# Patient Record
Sex: Male | Born: 1965 | Race: White | Hispanic: No | Marital: Married | Smoking: Never smoker
Health system: Southern US, Community
[De-identification: ages and names within clinical notes are randomized; demographics above are authoritative.]

## PROBLEM LIST (undated history)

## (undated) DIAGNOSIS — R0600 Dyspnea, unspecified: Secondary | ICD-10-CM

## (undated) DIAGNOSIS — M1990 Unspecified osteoarthritis, unspecified site: Secondary | ICD-10-CM

## (undated) DIAGNOSIS — J45909 Unspecified asthma, uncomplicated: Secondary | ICD-10-CM

## (undated) DIAGNOSIS — Z87442 Personal history of urinary calculi: Secondary | ICD-10-CM

## (undated) DIAGNOSIS — K219 Gastro-esophageal reflux disease without esophagitis: Secondary | ICD-10-CM

## (undated) DIAGNOSIS — F419 Anxiety disorder, unspecified: Secondary | ICD-10-CM

## (undated) DIAGNOSIS — T8859XA Other complications of anesthesia, initial encounter: Secondary | ICD-10-CM

## (undated) DIAGNOSIS — J189 Pneumonia, unspecified organism: Secondary | ICD-10-CM

## (undated) DIAGNOSIS — I10 Essential (primary) hypertension: Secondary | ICD-10-CM

## (undated) DIAGNOSIS — G4730 Sleep apnea, unspecified: Secondary | ICD-10-CM

## (undated) HISTORY — PX: HERNIA REPAIR: SHX51

## (undated) HISTORY — PX: LIPOMA EXCISION: SHX5283

---
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
## 2015-09-09 ENCOUNTER — Encounter (HOSPITAL_COMMUNITY): Payer: Self-pay

## 2015-09-09 ENCOUNTER — Emergency Department (HOSPITAL_COMMUNITY): Payer: Medicaid Other

## 2015-09-09 ENCOUNTER — Emergency Department (HOSPITAL_COMMUNITY)
Admission: EM | Admit: 2015-09-09 | Discharge: 2015-09-09 | Disposition: A | Discharge status: Home / Self Care | Payer: Medicaid Other | Attending: Emergency Medicine | Admitting: Emergency Medicine

## 2015-09-09 DIAGNOSIS — R0602 Shortness of breath: Secondary | ICD-10-CM

## 2015-09-09 DIAGNOSIS — R072 Precordial pain: Secondary | ICD-10-CM | POA: Diagnosis present

## 2015-09-09 DIAGNOSIS — J45901 Unspecified asthma with (acute) exacerbation: Secondary | ICD-10-CM | POA: Insufficient documentation

## 2015-09-09 DIAGNOSIS — K219 Gastro-esophageal reflux disease without esophagitis: Secondary | ICD-10-CM | POA: Diagnosis not present

## 2015-09-09 HISTORY — DX: Pneumonia, unspecified organism: J18.9

## 2015-09-09 LAB — BASIC METABOLIC PANEL
Anion gap: 9 (ref 5–15)
BUN: 17 mg/dL (ref 6–20)
CHLORIDE: 108 mmol/L (ref 101–111)
CO2: 23 mmol/L (ref 22–32)
Calcium: 9.7 mg/dL (ref 8.9–10.3)
Creatinine, Ser: 1.2 mg/dL (ref 0.61–1.24)
GFR calc non Af Amer: >60 mL/min (ref >60)
Glucose, Bld: 76 mg/dL (ref 65–99)
POTASSIUM: 4 mmol/L (ref 3.5–5.1)
Sodium: 140 mmol/L (ref 135–145)

## 2015-09-09 LAB — I-STAT TROPONIN, ED
TROPONIN I, POC: 0 ng/mL (ref 0.00–0.08)
Troponin i, poc: 0 ng/mL (ref 0.00–0.08)

## 2015-09-09 LAB — CBC
HEMATOCRIT: 48.1 % (ref 39.0–52.0)
HEMOGLOBIN: 16.3 g/dL (ref 13.0–17.0)
MCH: 29.6 pg (ref 26.0–34.0)
MCHC: 33.9 g/dL (ref 30.0–36.0)
MCV: 87.5 fL (ref 78.0–100.0)
PLATELETS: 198 10*3/uL (ref 150–400)
RBC: 5.5 MIL/uL (ref 4.22–5.81)
RDW: 12.9 % (ref 11.5–15.5)
WBC: 10.3 10*3/uL (ref 4.0–10.5)

## 2015-09-09 LAB — I-STAT CG4 LACTIC ACID, ED: LACTIC ACID, VENOUS: 1.79 mmol/L (ref 0.5–1.9)

## 2015-09-09 MED ORDER — ASPIRIN 81 MG PO CHEW
324.0000 mg | CHEWABLE_TABLET | Freq: Once | ORAL | Status: AC
  Administered 2015-09-09: 324 mg via ORAL
  Filled 2015-09-09: qty 4

## 2015-09-09 MED ORDER — FLUTICASONE-SALMETEROL 100-50 MCG/DOSE IN AEPB: 1.0000 | INHALATION_SPRAY | Freq: Every day | RESPIRATORY_TRACT | Status: DC

## 2015-09-09 MED ORDER — SODIUM CHLORIDE 0.9 % IV BOLUS (SEPSIS)
1000.0000 mL | Freq: Once | INTRAVENOUS | Status: AC
  Administered 2015-09-09: 1000 mL via INTRAVENOUS

## 2015-09-09 MED ORDER — IPRATROPIUM-ALBUTEROL 0.5-2.5 (3) MG/3ML IN SOLN
3.0000 mL | Freq: Once | RESPIRATORY_TRACT | Status: AC
  Administered 2015-09-09: 3 mL via RESPIRATORY_TRACT
  Filled 2015-09-09: qty 3

## 2015-09-09 NOTE — ED Notes (Addendum)
Patient here with recurrent chest pain with cough and congestion for the past week. 3 weeks ago had similar symptoms and went to urgent care and diagnosed with pneumonia. Took medications as prescribed and now complains of feeling bad again. No distress on assessment. States having CP and increased shortness of breath that worsened today.

## 2015-09-09 NOTE — Discharge Instructions (Signed)

## 2015-09-09 NOTE — ED Provider Notes (Signed)
CSN: [id number]     Arrival date & time 09/09/15  1500 History   First MD Initiated Contact with Patient 09/09/15 1642     Chief Complaint  Patient presents with  . Chest Pain  . Cough     (Consider location/radiation/quality/duration/timing/severity/associated sxs/prior Treatment) Patient is a 50 y.o. male presenting with chest pain and cough.  Chest Pain Pain location:  Substernal area Pain quality: pressure and tightness   Pain radiates to:  Does not radiate Pain radiates to the back: no   Pain severity:  Mild Onset quality:  Gradual Duration:  3 days Timing:  Intermittent Progression:  Waxing and waning Context comment:  Pain gets worse working outside in the heat and dust Relieved by: Albuterol inhaler. Relief lasts approximately 15 minutes. Worsened by:  Coughing and deep breathing Associated symptoms: cough, heartburn (patient states he gets heartburn daily despite omeprazole for last 3-4 years), nausea and shortness of breath   Associated symptoms: no abdominal pain, no altered mental status, no anorexia, no back pain, no diaphoresis, no dizziness, no dysphagia, no fatigue, no fever, no headache, no near-syncope, no numbness, no orthopnea, no palpitations, not vomiting and no weakness   Cough Associated symptoms: chest pain and shortness of breath   Associated symptoms: no chills, no diaphoresis, no fever, no headaches and no rash     Past Medical History  Diagnosis Date  . Pneumonia    History reviewed. No pertinent past surgical history. No family history on file. Social History  Substance Use Topics  . Smoking status: Never Smoker   . Smokeless tobacco: None  . Alcohol Use: None    Review of Systems  Constitutional: Negative for fever, chills, diaphoresis and fatigue.  HENT: Negative for congestion and trouble swallowing.   Eyes: Negative for visual disturbance.  Respiratory: Positive for cough, chest tightness and shortness of breath.   Cardiovascular:  Positive for chest pain. Negative for palpitations, orthopnea and near-syncope.  Gastrointestinal: Positive for heartburn (patient states he gets heartburn daily despite omeprazole for last 3-4 years) and nausea. Negative for vomiting, abdominal pain and anorexia.  Genitourinary: Negative for dysuria and flank pain.  Musculoskeletal: Negative for back pain and neck pain.  Skin: Negative for rash.  Neurological: Negative for dizziness, weakness, numbness and headaches.  Psychiatric/Behavioral: Negative for confusion and agitation.      Allergies  Review of patient's allergies indicates no known allergies.  Home Medications   Prior to Admission medications   Medication Sig Start Date End Date Taking? Authorizing Provider  Fluticasone-Salmeterol (ADVAIR DISKUS) 100-50 MCG/DOSE AEPB Inhale 1 puff into the lungs daily. 09/09/15   Levora AngelEric Elly Haffey, MD  omeprazole (PRILOSEC) 20 MG capsule Take 20 mg by mouth daily.   Yes Historical Provider, MD   BP 146/97 mmHg  Pulse 86  Temp(Src) 98.2 F (36.8 C) (Oral)  Resp 21  Ht 5\' 7"  (1.702 m)  Wt 95.255 kg  BMI 32.88 kg/m2  SpO2 97% Physical Exam  Constitutional: He is oriented to person, place, and time. He appears well-developed and well-nourished. No distress.  HENT:  Head: Normocephalic and atraumatic.  Eyes: Conjunctivae are normal.  Cardiovascular: Normal rate and normal heart sounds.   No murmur heard. Pulmonary/Chest: Effort normal. No respiratory distress. He has wheezes. He has rales.  Abdominal: Soft. There is no tenderness. There is no rebound.  Musculoskeletal: He exhibits no edema.  Neurological: He is alert and oriented to person, place, and time.  Skin: Skin is warm. He is not diaphoretic.  Psychiatric: He has a normal mood and affect. His behavior is normal.  Nursing note and vitals reviewed.   ED Course  Procedures (including critical care time) Labs Review Labs Reviewed  BASIC METABOLIC PANEL  CBC  I-STAT TROPOININ,  ED  I-STAT CG4 LACTIC ACID, ED  I-STAT TROPOININ, ED  Rosezena Sensor, ED    Imaging Review Dg Chest 2 View  09/09/2015  CLINICAL DATA:  50 year old male with chest pain, cough, congestion and shortness of breath for 1 week. EXAM: CHEST  2 VIEW COMPARISON:  None. FINDINGS: Upper limits normal heart size noted. There is no evidence of focal airspace disease, pulmonary edema, suspicious pulmonary nodule/mass, pleural effusion, or pneumothorax. No acute bony abnormalities are identified. A remote left rib fractures present. IMPRESSION: Upper limits normal heart size without evidence of active cardiopulmonary disease. Electronically Signed   By: Harmon Pier M.D.   On: 09/09/2015 16:01   I have personally reviewed and evaluated these images and lab results as part of my medical decision-making.   EKG Interpretation   Date/Time:  Saturday September 09 2015 15:09:30 EDT Ventricular Rate:  111 PR Interval:  152 QRS Duration: 74 QT Interval:  314 QTC Calculation: 427 R Axis:   36 Text Interpretation:  Sinus tachycardia No previous tracing Confirmed by  Denton Lank  MD, Caryn Bee ([id number]) on 09/09/2015 4:43:24 PM      MDM   Final diagnoses:  Shortness of breath  Asthma, unspecified asthma severity, with acute exacerbation  Gastroesophageal reflux disease, esophagitis presence not specified   Patient presents with approximately 2 months of intermittent shortness of breath, chest pain exacerbated by cough and fatigue. He states symptoms are improved for approximately 15-20 minutes after albuterol inhaler. He's only started using the inhaler in the last 3-4 days. He was diagnosed with pneumonia and given a course of azithromycin, by mouth steroids, and the inhaler approximately one week ago.  Chest x-ray today shows no evidence of cardiac or pulmonary abnormality. EKG shows sinus tachycardia with ventricular rate of 111 bpm, no abnormal intervals, dysrhythmia, or signs of ischemia. Tachycardia likely due to  albuterol inhaler just prior to arrival.  Patient reports shortness of breath improved with DuoNeb treatment. Sensation of heaviness in his chest no longer present. Delta troponin was negative 2. Given patient has multiple exposures to dust and heat in his daily job I think pulmonary disease is the most likely etiology of the shortness of breath. Specifically I'm concerned for late onset asthma. Given the unusual presentation of his asthma and the multiple occupational exposures, he was given a referral to pulmonology for further workup and evaluation of his shortness of breath. I have low concern for cardiac etiology given symptom association with heat and occupational exposures and relief with albuterol.  Patient was discharged home in good condition with prescription for Advair discus inhaler and was encouraged to continue using his albuterol as directed.    Levora Angel, MD 09/10/15 6045  Levora Angel, MD 09/10/15 4098  Cathren Laine, MD 09/10/15 1535

## 2015-12-31 ENCOUNTER — Encounter (HOSPITAL_COMMUNITY): Payer: Self-pay | Admitting: Emergency Medicine

## 2015-12-31 ENCOUNTER — Emergency Department (HOSPITAL_COMMUNITY)
Admission: EM | Admit: 2015-12-31 | Discharge: 2015-12-31 | Disposition: A | Discharge status: Home / Self Care | Payer: Medicaid Other | Attending: Emergency Medicine | Admitting: Emergency Medicine

## 2015-12-31 ENCOUNTER — Emergency Department (HOSPITAL_COMMUNITY): Payer: Medicaid Other

## 2015-12-31 DIAGNOSIS — W268XXA Contact with other sharp object(s), not elsewhere classified, initial encounter: Secondary | ICD-10-CM | POA: Insufficient documentation

## 2015-12-31 DIAGNOSIS — I10 Essential (primary) hypertension: Secondary | ICD-10-CM | POA: Insufficient documentation

## 2015-12-31 DIAGNOSIS — S61219A Laceration without foreign body of unspecified finger without damage to nail, initial encounter: Secondary | ICD-10-CM

## 2015-12-31 DIAGNOSIS — Y999 Unspecified external cause status: Secondary | ICD-10-CM | POA: Insufficient documentation

## 2015-12-31 DIAGNOSIS — Y939 Activity, unspecified: Secondary | ICD-10-CM | POA: Diagnosis not present

## 2015-12-31 DIAGNOSIS — S61210A Laceration without foreign body of right index finger without damage to nail, initial encounter: Secondary | ICD-10-CM | POA: Insufficient documentation

## 2015-12-31 DIAGNOSIS — Y929 Unspecified place or not applicable: Secondary | ICD-10-CM | POA: Diagnosis not present

## 2015-12-31 DIAGNOSIS — S61212A Laceration without foreign body of right middle finger without damage to nail, initial encounter: Secondary | ICD-10-CM

## 2015-12-31 HISTORY — DX: Essential (primary) hypertension: I10

## 2015-12-31 NOTE — ED Notes (Signed)
Declined W/C at D/C and was escorted to lobby by RN. 

## 2015-12-31 NOTE — ED Triage Notes (Signed)
Pt. Stated, I was messing around with metal in the back building and cut my rt. 2 fingers.

## 2015-12-31 NOTE — ED Provider Notes (Signed)
[facility]-EMERGENCY DEPT Provider Note   CSN: [id number] Arrival date & time: 12/31/15  1254  By signing my name below, I, Clarisse GougeXavier Herndon, attest that this documentation has been prepared under the direction and in the presence of Cheri FowlerKayla Zubayr Bednarczyk, [place]. Electronically Signed: Clarisse GougeXavier Herndon, Scribe. 12/31/15. 2:25 PM.    History   Chief Complaint Chief Complaint  Patient presents with  . Finger Injury   The history is provided by the patient. No language interpreter was used.   HPI Comments: Shawn Owens is a 50 y.o. male who presents to the Emergency Department complaining of right index and middle digit laceration. He states that he was working on metal when the metal sheet slipped and cut his fingers. Pt reports associated throbbing pain. He denies numbness or weakness. Able to move all fingers without difficulty. No numbness or weakness.   Tetanus UTD.  Past Medical History:  Diagnosis Date  . Hypertension   . Pneumonia     There are no active problems to display for this patient.   History reviewed. No pertinent surgical history.     Home Medications    Prior to Admission medications   Medication Sig Start Date End Date Taking? Authorizing Provider  lisinopril (PRINIVIL,ZESTRIL) 20 MG tablet Take 20 mg by mouth daily.   Yes Historical Provider, MD  Fluticasone-Salmeterol (ADVAIR DISKUS) 100-50 MCG/DOSE AEPB Inhale 1 puff into the lungs daily. 09/09/15   Levora AngelEric Engstrom, MD  omeprazole (PRILOSEC) 20 MG capsule Take 20 mg by mouth daily.    Historical Provider, MD    Family History No family history on file.  Social History Social History  Substance Use Topics  . Smoking status: Never Smoker  . Smokeless tobacco: Never Used  . Alcohol use No     Allergies   Patient has no known allergies.   Review of Systems Review of Systems  Skin: Positive for wound.  Neurological: Negative for weakness and numbness.  All other systems reviewed and are negative.    Physical  Exam Updated Vital Signs BP 133/97 (BP Location: Left Arm)   Pulse 78   Temp 98.2 F (36.8 C) (Oral)   Resp 17   Ht 5\' 10"  (1.778 m)   Wt 99 kg   SpO2 97%   BMI 31.32 kg/m   Physical Exam  Constitutional: He is oriented to person, place, and time. He appears well-developed and well-nourished.  HENT:  Head: Normocephalic and atraumatic.  Eyes: Conjunctivae are normal.  Neck: Normal range of motion.  Cardiovascular:  Capillary refill less than 3 seconds distal to injury.   Pulmonary/Chest: Effort normal. No respiratory distress.  Abdominal: He exhibits no distension.  Musculoskeletal: He exhibits tenderness.  Moves all fingers of right hand easily.  FAROM of MCP, PIPJ, and DIPJ.   Neurological: He is alert and oriented to person, place, and time.  Strength and sensation intact distal to injury.  Skin: Skin is warm and dry. Laceration noted.  Right index finger with 1 cm superficial linear laceration to epidermis/dermis border on volar aspect just distal to the MCP; right middle finger with 1 cm superficial linear laceration to the epidermis on volar aspect just distal to the MCP.     ED Treatments / Results  DIAGNOSTIC STUDIES: Oxygen Saturation is 97% on RA, normal by my interpretation.    COORDINATION OF CARE: 2:25 PM Discussed treatment plan with pt at bedside and pt agreed to plan.   Labs (all labs ordered are listed, but only abnormal results  are displayed) Labs Reviewed - No data to display  EKG  EKG Interpretation None       Radiology Dg Hand Complete Right  Result Date: 12/31/2015 CLINICAL DATA:  Right hand laceration, initial encounter EXAM: RIGHT HAND - COMPLETE 3+ VIEW COMPARISON:  None. FINDINGS: No acute bony abnormality is noted. No metallic foreign body is seen. Soft tissue laceration consistent with the given clinical history is noted in the proximal second and third digits. IMPRESSION: Soft tissue injury without acute bony abnormality.  Electronically Signed   By: Alcide CleverMark  Lukens M.D.   On: 12/31/2015 14:06    Procedures Procedures (including critical care time)  Medications Ordered in ED Medications - No data to display   Initial Impression / Assessment and Plan / ED Course  I have reviewed the triage vital signs and the nursing notes.  Pertinent labs & imaging results that were available during my care of the patient were reviewed by me and considered in my medical decision making (see chart for details).  Clinical Course     Tetanus UTD. Laceration occurred < 12 hours prior to repair. Discussed laceration care with pt and answered questions. Superficial lacerations that do not need repair.  Wound irrigated and cleaned and band aids placed.   Return precautions discussed. Pt is hemodynamically stable with no complaints prior to dc.     Final Clinical Impressions(s) / ED Diagnoses   Final diagnoses:  Laceration of right index finger without foreign body without damage to nail, initial encounter  Laceration of right middle finger without foreign body without damage to nail, initial encounter    New Prescriptions New Prescriptions   No medications on file   I personally performed the services described in this documentation, which was scribed in my presence. The recorded information has been reviewed and is accurate.    Cheri FowlerKayla Jennea Rager, PA-C 12/31/15 1425    Jacalyn LefevreJulie Haviland, MD 12/31/15 1425

## 2017-06-13 IMAGING — CR DG CHEST 2V
2 series · 2 of 2 positions shown · non-contrast
Comparison: None.

CLINICAL DATA: 49-year-old male with chest pain, cough, congestion
and shortness of breath for 1 week.

EXAM:
CHEST  2 VIEW

[chest pa]
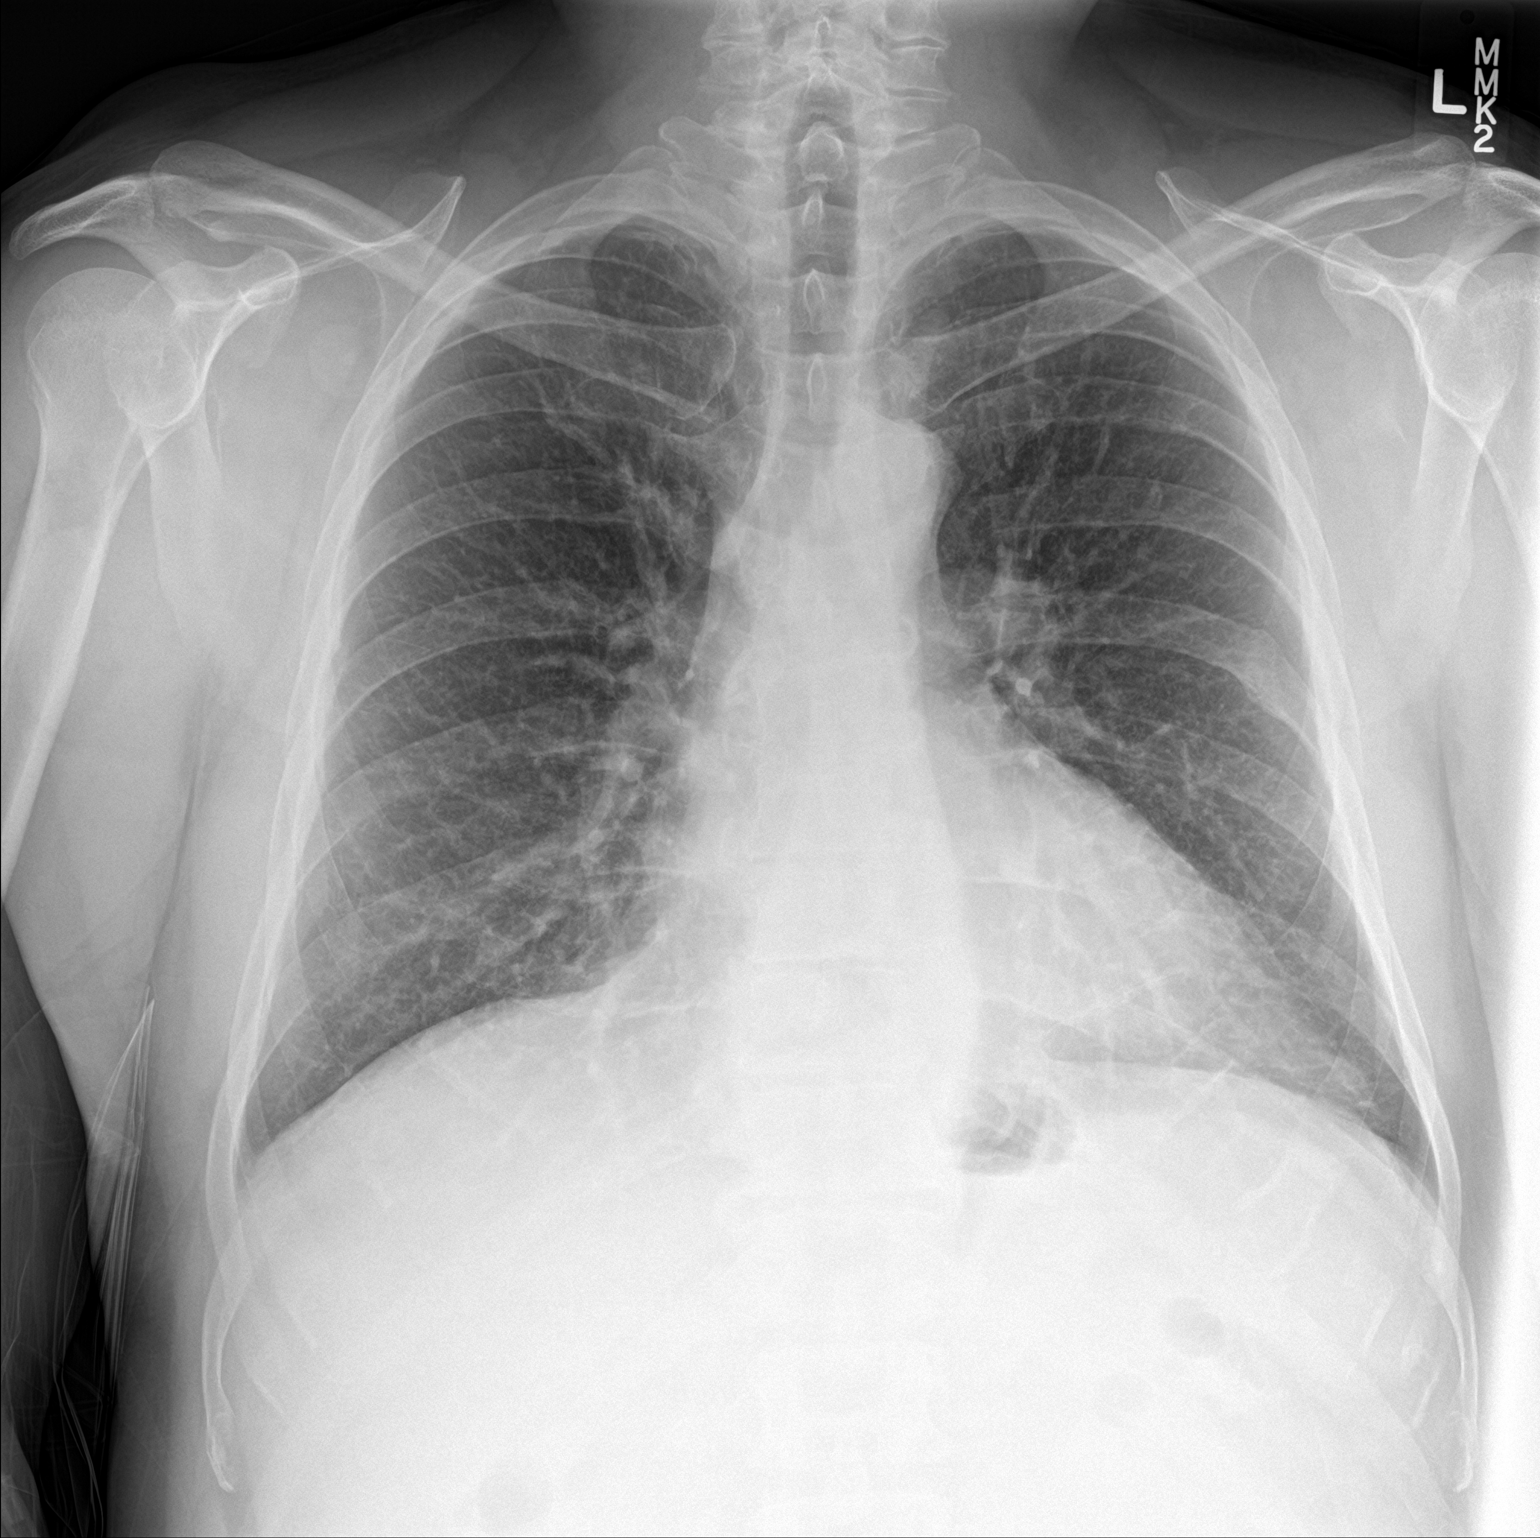

[chest lat]
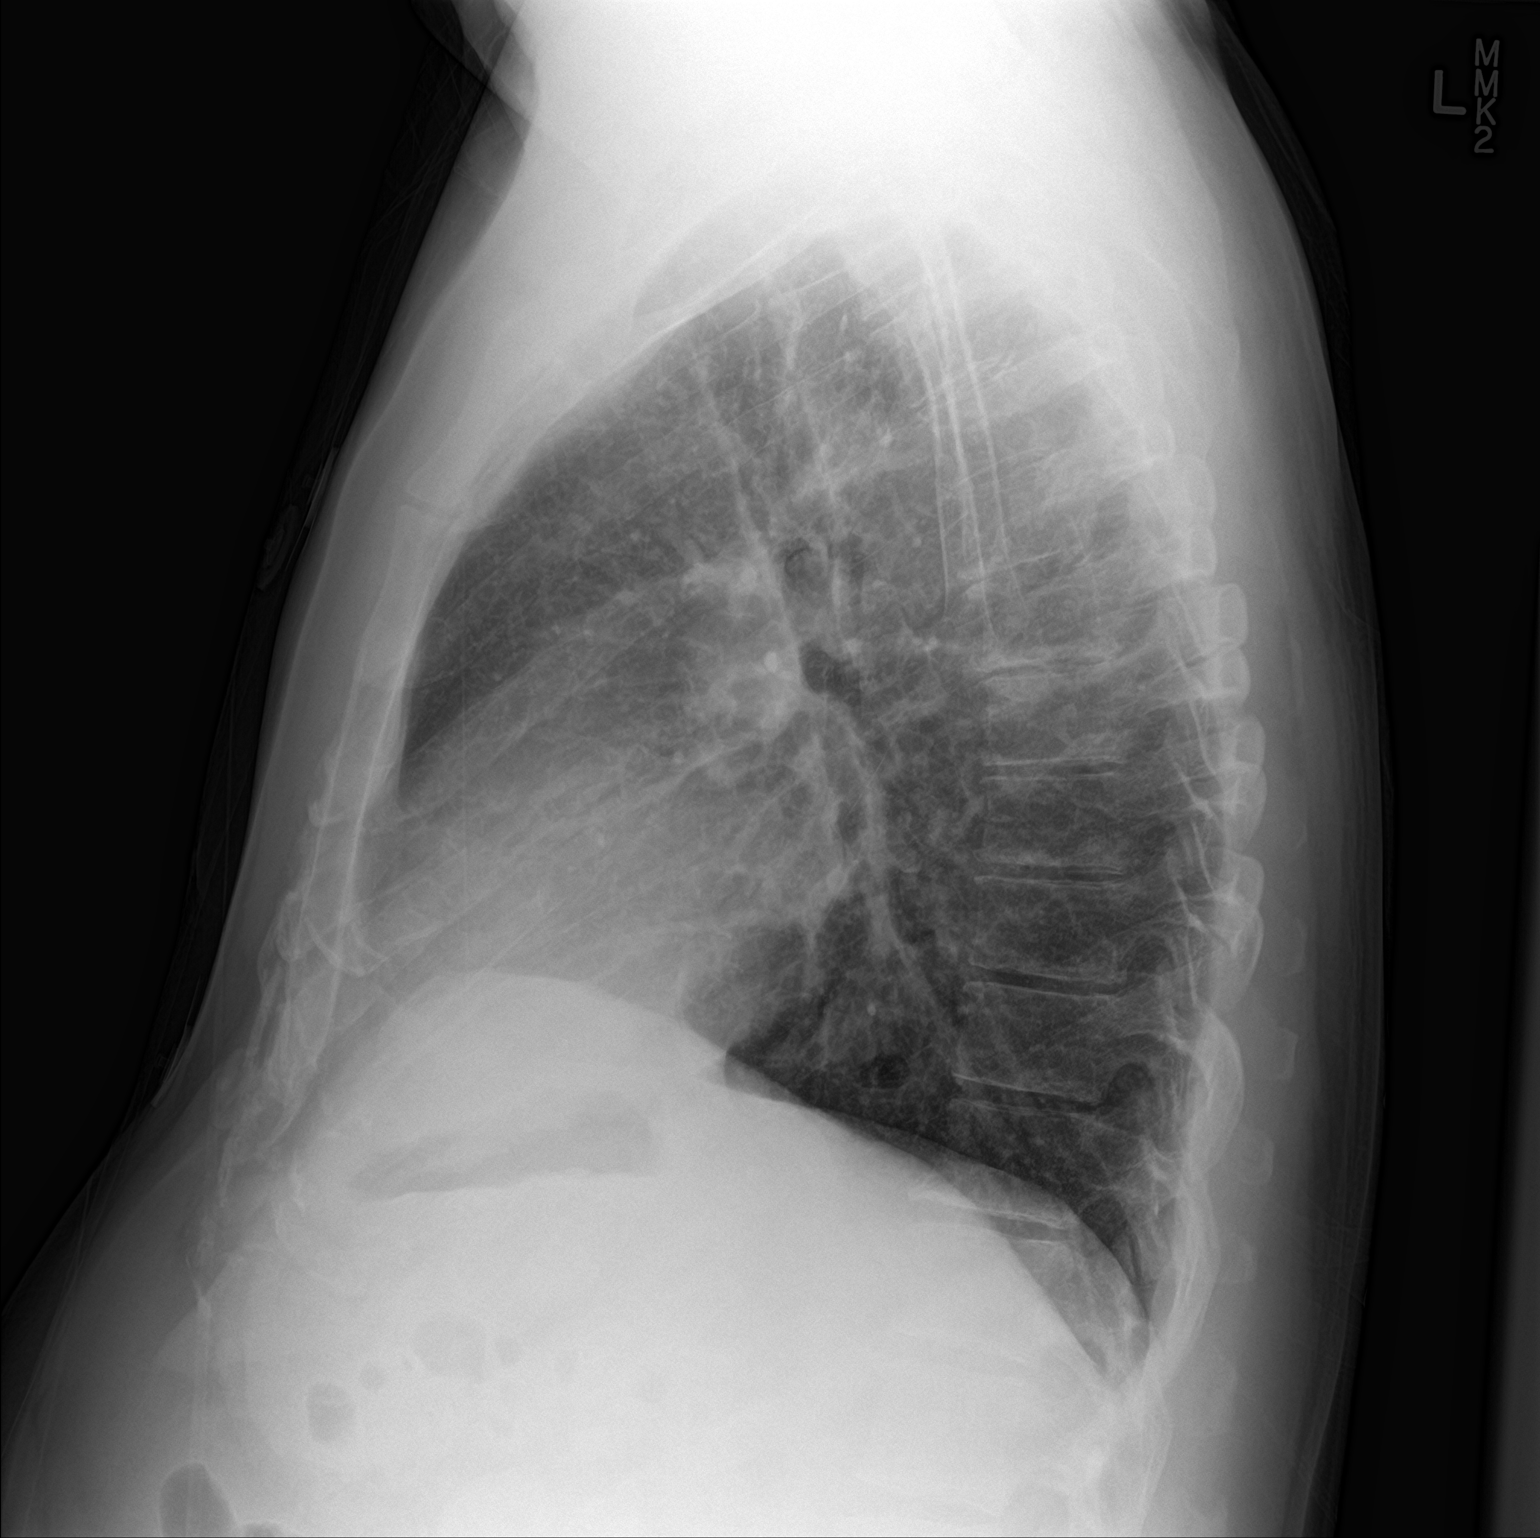

[2 of 2 positions shown; findings below may reference images not displayed]

FINDINGS: Upper limits normal heart size noted.

There is no evidence of focal airspace disease, pulmonary edema,
suspicious pulmonary nodule/mass, pleural effusion, or pneumothorax.
No acute bony abnormalities are identified.

A remote left rib fractures present.
IMPRESSION: Upper limits normal heart size without evidence of active
cardiopulmonary disease.

## 2021-02-08 ENCOUNTER — Ambulatory Visit: Payer: Self-pay | Admitting: General Surgery

## 2021-02-08 NOTE — H&P (Signed)
Chief Complaint: New Consultation (Ing Hernia )       History of Present Illness: Shawn Owens is a 55 y.o. male who is seen today as an office consultation at the request of Dr. Anna Genre for evaluation of New Consultation (Ing Hernia ) .   Patient is a 55 year old male, with history of anxiety, hypertension, comes in secondary to a large left inguinal hernia. Patient states that the hernias been there for several months.  He states is gotten larger and gravitating towards the scrotum.  He states he has some burning sensation as well as skin breakdown secondary to the size of the hernia.  Patient also has noticed that he has had an umbilical hernia just above his umbilicus.  There is minimal pain to this area.   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   Patient's had no signs or symptoms of incarceration or strangulation.     Review of Systems: A complete review of systems was obtained from the patient.  I have reviewed this information and discussed as appropriate with the patient.  See HPI as well for other ROS.   Review of Systems  Constitutional: Negative for fever.  HENT: Negative for congestion.   Eyes: Negative for blurred vision.  Respiratory: Negative for cough, shortness of breath and wheezing.   Cardiovascular: Negative for chest pain and palpitations.  Gastrointestinal: Negative for heartburn.  Genitourinary: Negative for dysuria.  Musculoskeletal: Negative for myalgias.  Skin: Negative for rash.  Neurological: Negative for dizziness and headaches.  Psychiatric/Behavioral: Negative for depression and suicidal ideas.  All other systems reviewed and are negative.       Medical History: Past Medical History Past Medical History: Diagnosis Date  Anxiety    Arthritis    Asthma, unspecified asthma severity, unspecified whether complicated, unspecified whether persistent    Genital herpes    GERD (gastroesophageal reflux disease)     Hypertension    Shortness of breath        Patient Active Problem List Diagnosis  BMI 32.0-32.9,adult  Mass of skin of right shoulder  Osteoarthritis     Past Surgical History History reviewed. No pertinent surgical history.     Allergies No Known Allergies    Current Outpatient Medications on File Prior to Visit Medication Sig Dispense Refill  acyclovir (ZOVIRAX) 200 MG capsule Take 200 mg by mouth 2 (two) times daily.      albuterol 90 mcg/actuation inhaler Inhale 2 inhalations into the lungs every 4 (four) hours as needed for Wheezing.      omeprazole (PRILOSEC) 20 MG DR capsule Take 20 mg by mouth 2 (two) times daily.       No current facility-administered medications on file prior to visit.     Family History History reviewed. No pertinent family history.     Social History   Tobacco Use Smoking Status Never Smokeless Tobacco Never     Social History Social History    Socioeconomic History  Marital status: Married Tobacco Use  Smoking status: Never  Smokeless tobacco: Never Vaping Use  Vaping Use: Never used Substance and Sexual Activity  Alcohol use: Not Currently  Drug use: Never      Objective:     Vitals:   02/08/21 0840 BP: (!) 144/82 Pulse: 94 Temp: 36.7 C (98.1 F) SpO2: 97% Weight: (!) 104.1 kg (229 lb 6.4 oz) Height: 177.8 cm (5\' 10" )   Body mass index is 32.92 kg/m. Physical Exam Constitutional:  Appearance: Normal appearance.  HENT:     Head: Normocephalic and atraumatic.     Nose: Nose normal. No congestion.     Mouth/Throat:     Mouth: Mucous membranes are moist.     Pharynx: Oropharynx is clear.  Eyes:     Pupils: Pupils are equal, round, and reactive to light.  Cardiovascular:     Rate and Rhythm: Normal rate and regular rhythm.     Pulses: Normal pulses.     Heart sounds: Normal heart sounds. No murmur heard.   No friction rub. No gallop.  Pulmonary:     Effort: Pulmonary effort is normal. No  respiratory distress.     Breath sounds: Normal breath sounds. No stridor. No wheezing, rhonchi or rales.  Abdominal:     General: Abdomen is flat.     Hernia: A hernia is present. Hernia is present in the umbilical area (1cm) and left inguinal area. There is no hernia in the right inguinal area.  Musculoskeletal:        General: Normal range of motion.     Cervical back: Normal range of motion.  Skin:    General: Skin is warm and dry.  Neurological:     General: No focal deficit present.     Mental Status: He is alert and oriented to person, place, and time.  Psychiatric:        Mood and Affect: Mood normal.        Thought Content: Thought content normal.          Assessment and Plan: Diagnoses and all orders for this visit:   Umbilical hernia without obstruction or gangrene   Non-recurrent unilateral inguinal hernia without obstruction or gangrene     Shawn Owens is a 55 y.o. male    1.  We will proceed to the OR for a robotic left inguinal hernia repair with mesh, and open umbilical hernia repair +/- mesh 2. All risks and benefits were discussed with the patient, to generally include infection, bleeding, damage to surrounding structures, acute and chronic nerve pain, and recurrence. Alternatives were offered and described.  All questions were answered and the patient voiced understanding of the procedure and wishes to proceed at this point.             No follow-ups on file.   Axel Filler, MD, [facility], [place] [facility]

## 2021-03-14 NOTE — Pre-Procedure Instructions (Signed)
Surgical Instructions    Your procedure is scheduled on Tuesday 03/20/21.   Report to [facility] Main Entrance "A" at 09:00 A.M., then check in with the Admitting office.  Call this number if you have problems the morning of surgery:  [phone]   If you have any questions prior to your surgery date call [phone]: Open Monday-Friday 8am-4pm    Remember:  Do not eat after midnight the night before your surgery  You may drink clear liquids until 08:00 A.M. the morning of your surgery.   Clear liquids allowed are: Water, Non-Citrus Juices (without pulp), Carbonated Beverages, Clear Tea, Black Coffee ONLY (NO MILK, CREAM OR POWDERED CREAMER of any kind), and Gatorade  Patient Instructions  The night before surgery:  No food after midnight. ONLY clear liquids after midnight  The day of surgery (if you do NOT have diabetes):  Drink ONE (1) Pre-Surgery Clear Ensure by 08:00 A.M. the morning of surgery. Drink in one sitting. Do not sip.  This drink was given to you during your hospital  pre-op appointment visit.  Nothing else to drink after completing the  Pre-Surgery Clear Ensure.         If you have questions, please contact your surgeons office.     Take these medicines the morning of surgery with A SIP OF WATER   acyclovir (ZOVIRAX)  ALPRAZolam (XANAX)   famotidine (PEPCID)   loratadine (CLARITIN)  omeprazole (PRILOSEC)   Take these medicines if needed:  albuterol (VENTOLIN HFA) 108 (90 Base)  mometasone (ASMANEX)  naphazoline-pheniramine (VISINE)     As of today, STOP taking any Aspirin (unless otherwise instructed by your surgeon) meloxicam (MOBIC), Aleve, Naproxen, Ibuprofen, Motrin, Advil, Goody's, BC's, all herbal medications, fish oil, and all vitamins.  After your COVID test   You are not required to quarantine however you are required to wear a well-fitting mask when you are out and around people not in your household.  If your mask becomes wet or soiled,  replace with a new one.  Wash your hands often with soap and water for 20 seconds or clean your hands with an alcohol-based hand sanitizer that contains at least 60% alcohol.  Do not share personal items.  Notify your provider: if you are in close contact with someone who has COVID  or if you develop a fever of 100.4 or greater, sneezing, cough, sore throat, shortness of breath or body aches.           Do not wear jewelry or makeup Do not wear lotions, powders, perfumes/colognes, or deodorant. Do not shave 48 hours prior to surgery.  Men may shave face and neck. Do not bring valuables to the hospital. DO Not wear nail polish, gel polish, artificial nails, or any other type of covering on natural nails (fingers and toes) If you have artificial nails or gel coating that need to be removed by a nail salon, please have this removed prior to surgery. Artificial nails or gel coating may interfere with anesthesia's ability to adequately monitor your vital signs.             [facility] is not responsible for any belongings or valuables.  Do NOT Smoke (Tobacco/Vaping)  24 hours prior to your procedure  If you use a CPAP at night, you may bring your mask for your overnight stay.   Contacts, glasses, hearing aids, dentures or partials may not be worn into surgery, please bring cases for these belongings   For patients  admitted to the hospital, discharge time will be determined by your treatment team.   Patients discharged the day of surgery will not be allowed to drive home, and someone needs to stay with them for 24 hours.  NO VISITORS WILL BE ALLOWED IN PRE-OP WHERE PATIENTS ARE PREPPED FOR SURGERY.  ONLY 1 SUPPORT PERSON MAY BE PRESENT IN THE WAITING ROOM WHILE YOU ARE IN SURGERY.  IF YOU ARE TO BE ADMITTED, ONCE YOU ARE IN YOUR ROOM YOU WILL BE ALLOWED TWO (2) VISITORS. 1 (ONE) VISITOR MAY STAY OVERNIGHT BUT MUST ARRIVE TO THE ROOM BY 8pm.  Minor children may have two parents present. Special  consideration for safety and communication needs will be reviewed on a case by case basis.  Special instructions:    Oral Hygiene is also important to reduce your risk of infection.  Remember - BRUSH YOUR TEETH THE MORNING OF SURGERY WITH YOUR REGULAR TOOTHPASTE   [place]- Preparing For Surgery  Before surgery, you can play an important role. Because skin is not sterile, your skin needs to be as free of germs as possible. You can reduce the number of germs on your skin by washing with CHG (chlorahexidine gluconate) Soap before surgery.  CHG is an antiseptic cleaner which kills germs and bonds with the skin to continue killing germs even after washing.     Please do not use if you have an allergy to CHG or antibacterial soaps. If your skin becomes reddened/irritated stop using the CHG.  Do not shave (including legs and underarms) for at least 48 hours prior to first CHG shower. It is OK to shave your face.  Please follow these instructions carefully.     Shower the NIGHT BEFORE SURGERY and the MORNING OF SURGERY with CHG Soap.   If you chose to wash your hair, wash your hair first as usual with your normal shampoo. After you shampoo, rinse your hair and body thoroughly to remove the shampoo.  Then [employer] and genitals (private parts) with your normal soap and rinse thoroughly to remove soap.  After that Use CHG Soap as you would any other liquid soap. You can apply CHG directly to the skin and wash gently with a scrungie or a clean washcloth.   Apply the CHG Soap to your body ONLY FROM THE NECK DOWN.  Do not use on open wounds or open sores. Avoid contact with your eyes, ears, mouth and genitals (private parts). Wash Face and genitals (private parts)  with your normal soap.   Wash thoroughly, paying special attention to the area where your surgery will be performed.  Thoroughly rinse your body with warm water from the neck down.  DO NOT shower/wash with your normal soap after using  and rinsing off the CHG Soap.  Pat yourself dry with a CLEAN TOWEL.  Wear CLEAN PAJAMAS to bed the night before surgery  Place CLEAN SHEETS on your bed the night before your surgery  DO NOT SLEEP WITH PETS.   Day of Surgery:  Take a shower with CHG soap. Wear Clean/Comfortable clothing the morning of surgery Do not apply any deodorants/lotions.   Remember to brush your teeth WITH YOUR REGULAR TOOTHPASTE.   Please read over the following fact sheets that you were given.

## 2021-03-15 ENCOUNTER — Encounter (HOSPITAL_COMMUNITY)
Admission: RE | Admit: 2021-03-15 | Discharge: 2021-03-15 | Disposition: A | Discharge status: Home / Self Care | Payer: Commercial Managed Care - HMO | Source: Ambulatory Visit | Attending: General Surgery | Admitting: General Surgery

## 2021-03-15 ENCOUNTER — Other Ambulatory Visit: Payer: Self-pay

## 2021-03-15 ENCOUNTER — Encounter (HOSPITAL_COMMUNITY): Payer: Self-pay

## 2021-03-15 DIAGNOSIS — Z01810 Encounter for preprocedural cardiovascular examination: Secondary | ICD-10-CM | POA: Insufficient documentation

## 2021-03-15 HISTORY — DX: Unspecified osteoarthritis, unspecified site: M19.90

## 2021-03-15 HISTORY — DX: Unspecified asthma, uncomplicated: J45.909

## 2021-03-15 HISTORY — DX: Anxiety disorder, unspecified: F41.9

## 2021-03-15 HISTORY — DX: Gastro-esophageal reflux disease without esophagitis: K21.9

## 2021-03-15 NOTE — Progress Notes (Signed)
PCP - Lonie Peak, PA-C Cardiologist - denies  PPM/ICD - denies   Chest x-ray - 09/09/15 EKG - 03/15/21 at PAT Stress Test - denies ECHO - denies Cardiac Cath - denies  Sleep Study - denies  DM- denies  Blood Thinner Instructions: n/a Aspirin Instructions: n/a  ERAS Protcol - yes PRE-SURGERY Ensure given at PAT  COVID TEST- n/a, ambulatory surgery   Anesthesia review: no  Patient denies shortness of breath, fever, cough and chest pain at PAT appointment   All instructions explained to the patient, with a verbal understanding of the material. Patient agrees to go over the instructions while at home for a better understanding. Patient also instructed to wear a mask in public for 3 days prior to surgery. The opportunity to ask questions was provided.

## 2021-03-15 NOTE — Progress Notes (Addendum)
Pt is extremely nervous with needles. He says that he always passes out when he has blood drawn. OK, per Fayrene Fearing, Anesthesia PA, to accept labs from 02/17/21 and no labs done at PAT. Pt is very nervous about having an IV started on the DOS because he worries that he will pass out. Fayrene Fearing, Anesthesia PA, aware of BP. Attributed to nerves. Pt stable and asymptomatic. EKG shows NSR.

## 2021-03-20 ENCOUNTER — Other Ambulatory Visit: Payer: Self-pay

## 2021-03-20 ENCOUNTER — Encounter (HOSPITAL_COMMUNITY): Payer: Self-pay | Admitting: General Surgery

## 2021-03-20 ENCOUNTER — Ambulatory Visit (HOSPITAL_COMMUNITY)
Admission: RE | Admit: 2021-03-20 | Discharge: 2021-03-20 | Disposition: A | Discharge status: Home / Self Care | Payer: Commercial Managed Care - HMO | Attending: General Surgery | Admitting: General Surgery

## 2021-03-20 ENCOUNTER — Ambulatory Visit (HOSPITAL_COMMUNITY): Payer: Commercial Managed Care - HMO | Admitting: Physician Assistant

## 2021-03-20 ENCOUNTER — Ambulatory Visit (HOSPITAL_COMMUNITY): Payer: Commercial Managed Care - HMO | Admitting: Anesthesiology

## 2021-03-20 ENCOUNTER — Encounter (HOSPITAL_COMMUNITY)
Admission: RE | Disposition: A | Discharge status: Home / Self Care | Payer: Self-pay | Source: Home / Self Care | Attending: General Surgery

## 2021-03-20 DIAGNOSIS — K429 Umbilical hernia without obstruction or gangrene: Secondary | ICD-10-CM | POA: Insufficient documentation

## 2021-03-20 DIAGNOSIS — I10 Essential (primary) hypertension: Secondary | ICD-10-CM | POA: Diagnosis not present

## 2021-03-20 DIAGNOSIS — J45909 Unspecified asthma, uncomplicated: Secondary | ICD-10-CM | POA: Insufficient documentation

## 2021-03-20 DIAGNOSIS — F419 Anxiety disorder, unspecified: Secondary | ICD-10-CM | POA: Insufficient documentation

## 2021-03-20 DIAGNOSIS — M1990 Unspecified osteoarthritis, unspecified site: Secondary | ICD-10-CM | POA: Diagnosis not present

## 2021-03-20 DIAGNOSIS — K219 Gastro-esophageal reflux disease without esophagitis: Secondary | ICD-10-CM | POA: Insufficient documentation

## 2021-03-20 DIAGNOSIS — K4030 Unilateral inguinal hernia, with obstruction, without gangrene, not specified as recurrent: Secondary | ICD-10-CM | POA: Diagnosis not present

## 2021-03-20 DIAGNOSIS — K4090 Unilateral inguinal hernia, without obstruction or gangrene, not specified as recurrent: Secondary | ICD-10-CM | POA: Diagnosis present

## 2021-03-20 HISTORY — PX: UMBILICAL HERNIA REPAIR: SHX196

## 2021-03-20 HISTORY — PX: INSERTION OF MESH: SHX5868

## 2021-03-20 SURGERY — HERNIORRHAPHY, INGUINAL, ROBOT-ASSISTED, LAPAROSCOPIC
Anesthesia: General | Site: Groin

## 2021-03-20 MED ORDER — FENTANYL CITRATE (PF) 250 MCG/5ML IJ SOLN
INTRAMUSCULAR | Status: DC | PRN
  Administered 2021-03-20: 50 ug via INTRAVENOUS
  Administered 2021-03-20: 100 ug via INTRAVENOUS

## 2021-03-20 MED ORDER — SUGAMMADEX SODIUM 200 MG/2ML IV SOLN
INTRAVENOUS | Status: DC | PRN
Start: 2021-03-20 — End: 2021-03-20
  Administered 2021-03-20: 300 mg via INTRAVENOUS

## 2021-03-20 MED ORDER — CEFAZOLIN SODIUM-DEXTROSE 2-4 GM/100ML-% IV SOLN
2.0000 g | INTRAVENOUS | Status: AC
  Administered 2021-03-20: 11:00:00 2 g via INTRAVENOUS

## 2021-03-20 MED ORDER — ACETAMINOPHEN 500 MG PO TABS
ORAL_TABLET | ORAL | Status: AC
  Administered 2021-03-20: 09:00:00 1000 mg via ORAL
  Filled 2021-03-20: qty 2

## 2021-03-20 MED ORDER — BUPIVACAINE LIPOSOME 1.3 % IJ SUSP
INTRAMUSCULAR | Status: AC
  Filled 2021-03-20: qty 20

## 2021-03-20 MED ORDER — CHLORHEXIDINE GLUCONATE CLOTH 2 % EX PADS: 6.0000 | MEDICATED_PAD | Freq: Once | CUTANEOUS | Status: DC

## 2021-03-20 MED ORDER — HYDROMORPHONE HCL 1 MG/ML IJ SOLN: 0.2500 mg | INTRAMUSCULAR | Status: DC | PRN

## 2021-03-20 MED ORDER — 0.9 % SODIUM CHLORIDE (POUR BTL) OPTIME
TOPICAL | Status: DC | PRN
Start: 2021-03-20 — End: 2021-03-20
  Administered 2021-03-20: 11:00:00 1000 mL

## 2021-03-20 MED ORDER — CHLORHEXIDINE GLUCONATE 0.12 % MT SOLN
OROMUCOSAL | Status: AC
  Administered 2021-03-20: 09:00:00 15 mL via OROMUCOSAL
  Filled 2021-03-20: qty 15

## 2021-03-20 MED ORDER — HYDRALAZINE HCL 20 MG/ML IJ SOLN: 5.0000 mg | INTRAMUSCULAR | Status: AC

## 2021-03-20 MED ORDER — SODIUM CHLORIDE 0.9% FLUSH
INTRAVENOUS | Status: DC | PRN
  Administered 2021-03-20 (×2): 10 mL

## 2021-03-20 MED ORDER — FENTANYL CITRATE (PF) 250 MCG/5ML IJ SOLN
INTRAMUSCULAR | Status: AC
  Filled 2021-03-20: qty 5

## 2021-03-20 MED ORDER — BUPIVACAINE HCL (PF) 0.25 % IJ SOLN
INTRAMUSCULAR | Status: AC
  Filled 2021-03-20: qty 30

## 2021-03-20 MED ORDER — ONDANSETRON HCL 4 MG/2ML IJ SOLN
INTRAMUSCULAR | Status: DC | PRN
Start: 2021-03-20 — End: 2021-03-20
  Administered 2021-03-20: 4 mg via INTRAVENOUS

## 2021-03-20 MED ORDER — DEXAMETHASONE SODIUM PHOSPHATE 10 MG/ML IJ SOLN
INTRAMUSCULAR | Status: DC | PRN
  Administered 2021-03-20: 10 mg via INTRAVENOUS

## 2021-03-20 MED ORDER — STERILE WATER FOR IRRIGATION IR SOLN
Status: DC | PRN
  Administered 2021-03-20: 1000 mL

## 2021-03-20 MED ORDER — BUPIVACAINE LIPOSOME 1.3 % IJ SUSP
INTRAMUSCULAR | Status: DC | PRN
  Administered 2021-03-20: 20 mL

## 2021-03-20 MED ORDER — KETOROLAC TROMETHAMINE 30 MG/ML IJ SOLN
INTRAMUSCULAR | Status: AC
  Filled 2021-03-20: qty 1

## 2021-03-20 MED ORDER — ORAL CARE MOUTH RINSE: 15.0000 mL | Freq: Once | OROMUCOSAL | Status: AC

## 2021-03-20 MED ORDER — BUPIVACAINE HCL 0.25 % IJ SOLN
INTRAMUSCULAR | Status: DC | PRN
  Administered 2021-03-20: 17 mL

## 2021-03-20 MED ORDER — PROPOFOL 10 MG/ML IV BOLUS
INTRAVENOUS | Status: DC | PRN
Start: 2021-03-20 — End: 2021-03-20
  Administered 2021-03-20: 150 mg via INTRAVENOUS

## 2021-03-20 MED ORDER — ENSURE PRE-SURGERY PO LIQD: 296.0000 mL | Freq: Once | ORAL | Status: DC

## 2021-03-20 MED ORDER — KETOROLAC TROMETHAMINE 30 MG/ML IJ SOLN
INTRAMUSCULAR | Status: DC | PRN
  Administered 2021-03-20: 30 mg via INTRAVENOUS

## 2021-03-20 MED ORDER — ROCURONIUM BROMIDE 10 MG/ML (PF) SYRINGE
PREFILLED_SYRINGE | INTRAVENOUS | Status: DC | PRN
Start: 2021-03-20 — End: 2021-03-20
  Administered 2021-03-20: 20 mg via INTRAVENOUS
  Administered 2021-03-20: 60 mg via INTRAVENOUS

## 2021-03-20 MED ORDER — BUPIVACAINE HCL (PF) 0.25 % IJ SOLN
INTRAMUSCULAR | Status: AC
  Filled 2021-03-20: qty 10

## 2021-03-20 MED ORDER — MIDAZOLAM HCL 2 MG/2ML IJ SOLN
INTRAMUSCULAR | Status: AC
  Filled 2021-03-20: qty 2

## 2021-03-20 MED ORDER — ACETAMINOPHEN 500 MG PO TABS: 1000.0000 mg | ORAL_TABLET | ORAL | Status: AC

## 2021-03-20 MED ORDER — BUPIVACAINE-EPINEPHRINE 0.5% -1:200000 IJ SOLN
INTRAMUSCULAR | Status: AC
  Filled 2021-03-20: qty 1

## 2021-03-20 MED ORDER — MIDAZOLAM HCL 2 MG/2ML IJ SOLN
INTRAMUSCULAR | Status: DC | PRN
  Administered 2021-03-20: 2 mg via INTRAVENOUS

## 2021-03-20 MED ORDER — CEFAZOLIN SODIUM-DEXTROSE 2-4 GM/100ML-% IV SOLN
INTRAVENOUS | Status: AC
  Filled 2021-03-20: qty 100

## 2021-03-20 MED ORDER — CHLORHEXIDINE GLUCONATE 0.12 % MT SOLN: 15.0000 mL | Freq: Once | OROMUCOSAL | Status: AC

## 2021-03-20 MED ORDER — TRAMADOL HCL 50 MG PO TABS: 50.0000 mg | ORAL_TABLET | Freq: Four times a day (QID) | ORAL | 0 refills | Status: DC | PRN

## 2021-03-20 MED ORDER — LACTATED RINGERS IV SOLN: INTRAVENOUS | Status: DC

## 2021-03-20 MED ORDER — LIDOCAINE 2% (20 MG/ML) 5 ML SYRINGE
INTRAMUSCULAR | Status: DC | PRN
Start: 2021-03-20 — End: 2021-03-20
  Administered 2021-03-20: 60 mg via INTRAVENOUS

## 2021-03-20 SURGICAL SUPPLY — 74 items
BAG COUNTER SPONGE SURGICOUNT (BAG) ×4 IMPLANT
BLADE CLIPPER SURG (BLADE) IMPLANT
CANISTER SUCT 3000ML PPV (MISCELLANEOUS) ×4 IMPLANT
CHLORAPREP W/TINT 26 (MISCELLANEOUS) ×4 IMPLANT
COVER MAYO STAND STRL (DRAPES) ×4 IMPLANT
COVER SURGICAL LIGHT HANDLE (MISCELLANEOUS) ×4 IMPLANT
COVER TIP SHEARS 8 DVNC (MISCELLANEOUS) ×3 IMPLANT
COVER TIP SHEARS 8MM DA VINCI (MISCELLANEOUS) ×1
DECANTER SPIKE VIAL GLASS SM (MISCELLANEOUS) ×3 IMPLANT
DEFOGGER SCOPE WARMER CLEARIFY (MISCELLANEOUS) ×3 IMPLANT
DERMABOND ADVANCED (GAUZE/BANDAGES/DRESSINGS) ×1
DERMABOND ADVANCED .7 DNX12 (GAUZE/BANDAGES/DRESSINGS) ×3 IMPLANT
DEVICE TROCAR PUNCTURE CLOSURE (ENDOMECHANICALS) ×4 IMPLANT
DRAPE ARM DVNC X/XI (DISPOSABLE) ×12 IMPLANT
DRAPE COLUMN DVNC XI (DISPOSABLE) ×3 IMPLANT
DRAPE CV SPLIT W-CLR ANES SCRN (DRAPES) ×4 IMPLANT
DRAPE DA VINCI XI ARM (DISPOSABLE) ×4
DRAPE DA VINCI XI COLUMN (DISPOSABLE) ×1
DRAPE LAPAROTOMY 100X72 PEDS (DRAPES) ×4 IMPLANT
DRAPE ORTHO SPLIT 77X108 STRL (DRAPES) ×1
DRAPE SURG ORHT 6 SPLT 77X108 (DRAPES) ×3 IMPLANT
ELECT REM PT RETURN 9FT ADLT (ELECTROSURGICAL) ×4
ELECTRODE REM PT RTRN 9FT ADLT (ELECTROSURGICAL) ×3 IMPLANT
GAUZE 4X4 16PLY ~~LOC~~+RFID DBL (SPONGE) ×4 IMPLANT
GLOVE SRG 8 PF TXTR STRL LF DI (GLOVE) ×3 IMPLANT
GLOVE SURG SYN 7.5  E (GLOVE) ×3
GLOVE SURG SYN 7.5 E (GLOVE) ×9 IMPLANT
GLOVE SURG SYN 7.5 PF PI (GLOVE) ×9 IMPLANT
GLOVE SURG UNDER POLY LF SZ8 (GLOVE) ×1
GOWN STRL REUS W/ TWL LRG LVL3 (GOWN DISPOSABLE) ×6 IMPLANT
GOWN STRL REUS W/ TWL XL LVL3 (GOWN DISPOSABLE) ×6 IMPLANT
GOWN STRL REUS W/TWL 2XL LVL3 (GOWN DISPOSABLE) ×4 IMPLANT
GOWN STRL REUS W/TWL LRG LVL3 (GOWN DISPOSABLE) ×2
GOWN STRL REUS W/TWL XL LVL3 (GOWN DISPOSABLE) ×2
KIT BASIN OR (CUSTOM PROCEDURE TRAY) ×4 IMPLANT
KIT TURNOVER KIT B (KITS) ×4 IMPLANT
MARKER SKIN DUAL TIP RULER LAB (MISCELLANEOUS) ×4 IMPLANT
MESH PROGRIP LAP SELF FIXATING (Mesh General) ×1 IMPLANT
MESH PROGRIP LAP SLF FIX 16X12 (Mesh General) ×3 IMPLANT
NDL HYPO 25GX1X1/2 BEV (NEEDLE) ×3 IMPLANT
NDL INSUFFLATION 14GA 120MM (NEEDLE) ×3 IMPLANT
NEEDLE HYPO 22GX1.5 SAFETY (NEEDLE) ×4 IMPLANT
NEEDLE HYPO 25GX1X1/2 BEV (NEEDLE) ×4 IMPLANT
NEEDLE INSUFFLATION 14GA 120MM (NEEDLE) ×4 IMPLANT
NS IRRIG 1000ML POUR BTL (IV SOLUTION) ×4 IMPLANT
OBTURATOR OPTICAL STANDARD 8MM (TROCAR)
OBTURATOR OPTICAL STND 8 DVNC (TROCAR)
OBTURATOR OPTICALSTD 8 DVNC (TROCAR) IMPLANT
PACK GENERAL/GYN (CUSTOM PROCEDURE TRAY) ×4 IMPLANT
PAD ARMBOARD 7.5X6 YLW CONV (MISCELLANEOUS) ×8 IMPLANT
PENCIL SMOKE EVACUATOR (MISCELLANEOUS) ×3 IMPLANT
SEAL CANN UNIV 5-8 DVNC XI (MISCELLANEOUS) ×6 IMPLANT
SEAL XI 5MM-8MM UNIVERSAL (MISCELLANEOUS) ×2
SET IRRIG TUBING LAPAROSCOPIC (IRRIGATION / IRRIGATOR) IMPLANT
SET TUBE SMOKE EVAC HIGH FLOW (TUBING) ×4 IMPLANT
STOPCOCK 4 WAY LG BORE MALE ST (IV SETS) ×4 IMPLANT
SUT ETHIBOND 0 MO6 C/R (SUTURE) ×1 IMPLANT
SUT MNCRL AB 4-0 PS2 18 (SUTURE) ×4 IMPLANT
SUT VIC AB 2-0 CT1 27 (SUTURE)
SUT VIC AB 2-0 CT1 TAPERPNT 27 (SUTURE) ×3 IMPLANT
SUT VIC AB 2-0 SH 27 (SUTURE)
SUT VIC AB 2-0 SH 27X BRD (SUTURE) IMPLANT
SUT VIC AB 3-0 SH 27 (SUTURE) ×2
SUT VIC AB 3-0 SH 27X BRD (SUTURE) IMPLANT
SUT VIC AB 3-0 SH 27XBRD (SUTURE) ×3 IMPLANT
SUT VICRYL 0 UR6 27IN ABS (SUTURE) ×1 IMPLANT
SUT VLOC 180 2-0 6IN GS21 (SUTURE) ×4 IMPLANT
SYR 30ML SLIP (SYRINGE) ×4 IMPLANT
SYR CONTROL 10ML LL (SYRINGE) ×4 IMPLANT
SYR TOOMEY 50ML (SYRINGE) ×3 IMPLANT
TOWEL GREEN STERILE (TOWEL DISPOSABLE) ×4 IMPLANT
TOWEL GREEN STERILE FF (TOWEL DISPOSABLE) ×4 IMPLANT
TRAY FOLEY MTR SLVR 14FR STAT (SET/KITS/TRAYS/PACK) IMPLANT
TRAY FOLEY MTR SLVR 16FR STAT (SET/KITS/TRAYS/PACK) IMPLANT

## 2021-03-20 NOTE — H&P (Signed)
Chief Complaint: New Consultation (Ing Hernia ) °  °  °  °History of Present Illness: °Shawn Owens is a 55 y.o. male who is seen today as an office consultation at the request of Dr. Conroy for evaluation of New Consultation (Ing Hernia ) °.   °Patient is a 55-year-old male, with history of anxiety, hypertension, comes in secondary to a large left inguinal hernia. °Patient states that the hernias been there for several months.  He states is gotten larger and gravitating towards the scrotum.  He states he has some burning sensation as well as skin breakdown secondary to the size of the hernia.  Patient also has noticed that he has had an umbilical hernia just above his umbilicus.  There is minimal pain to this area. 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 °  °Patient's had no signs or symptoms of incarceration or strangulation. °  °  °Review of Systems: °A complete review of systems was obtained from the patient.  I have reviewed this information and discussed as appropriate with the patient.  See HPI as well for other ROS. °  °Review of Systems  °Constitutional: Negative for fever.  °HENT: Negative for congestion.   °Eyes: Negative for blurred vision.  °Respiratory: Negative for cough, shortness of breath and wheezing.   °Cardiovascular: Negative for chest pain and palpitations.  °Gastrointestinal: Negative for heartburn.  °Genitourinary: Negative for dysuria.  °Musculoskeletal: Negative for myalgias.  °Skin: Negative for rash.  °Neurological: Negative for dizziness and headaches.  °Psychiatric/Behavioral: Negative for depression and suicidal ideas.  °All other systems reviewed and are negative. °  °  °  °Medical History: °Past Medical History °Past Medical History: °Diagnosis Date ° Anxiety   ° Arthritis   ° Asthma, unspecified asthma severity, unspecified whether complicated, unspecified whether persistent   ° Genital herpes   ° GERD (gastroesophageal reflux disease)    ° Hypertension   ° Shortness of breath   ° °  °  °Patient Active Problem List °Diagnosis ° BMI 32.0-32.9,adult ° Mass of skin of right shoulder ° Osteoarthritis °  °  °Past Surgical History °History reviewed. No pertinent surgical history. °  °  °Allergies °No Known Allergies ° °  °Current Outpatient Medications on File Prior to Visit °Medication Sig Dispense Refill ° acyclovir (ZOVIRAX) 200 MG capsule Take 200 mg by mouth 2 (two) times daily.     ° albuterol 90 mcg/actuation inhaler Inhale 2 inhalations into the lungs every 4 (four) hours as needed for Wheezing.     ° omeprazole (PRILOSEC) 20 MG DR capsule Take 20 mg by mouth 2 (two) times daily.     °  °No current facility-administered medications on file prior to visit. °  °  °Family History °History reviewed. No pertinent family history. °  °  °Social History °  °Tobacco Use °Smoking Status Never °Smokeless Tobacco Never °  °  °Social History °Social History °  ° °Socioeconomic History ° Marital status: Married °Tobacco Use ° Smoking status: Never ° Smokeless tobacco: Never °Vaping Use ° Vaping Use: Never used °Substance and Sexual Activity ° Alcohol use: Not Currently ° Drug use: Never ° °  °  °Objective: °  °  °Vitals: °  02/08/21 0840 °BP: (!) 144/82 °Pulse: 94 °Temp: 36.7 °C (98.1 °F) °SpO2: 97% °Weight: (!) 104.1 kg (229 lb 6.4 oz) °Height: 177.8 cm (5' 10") °  °Body mass index is 32.92 kg/m². °Physical Exam °Constitutional:   °     Appearance: Normal appearance.  °HENT:  °   Head: Normocephalic and atraumatic.  °   Nose: Nose normal. No congestion.  °   Mouth/Throat:  °   Mouth: Mucous membranes are moist.  °   Pharynx: Oropharynx is clear.  °Eyes:  °   Pupils: Pupils are equal, round, and reactive to light.  °Cardiovascular:  °   Rate and Rhythm: Normal rate and regular rhythm.  °   Pulses: Normal pulses.  °   Heart sounds: Normal heart sounds. No murmur heard. °  No friction rub. No gallop.  °Pulmonary:  °   Effort: Pulmonary effort is normal. No  respiratory distress.  °   Breath sounds: Normal breath sounds. No stridor. No wheezing, rhonchi or rales.  °Abdominal:  °   General: Abdomen is flat.  °   Hernia: A hernia is present. Hernia is present in the umbilical area (1cm) and left inguinal area. There is no hernia in the right inguinal area.  °Musculoskeletal:     °   General: Normal range of motion.  °   Cervical back: Normal range of motion.  °Skin: °   General: Skin is warm and dry.  °Neurological:  °   General: No focal deficit present.  °   Mental Status: He is alert and oriented to person, place, and time.  °Psychiatric:     °   Mood and Affect: Mood normal.     °   Thought Content: Thought content normal.  °  °  °  °  °Assessment and Plan: °Diagnoses and all orders for this visit: °  °Umbilical hernia without obstruction or gangrene °  °Non-recurrent unilateral inguinal hernia without obstruction or gangrene °  °  °Shawn Owens is a 55 y.o. male  °  °1.  We will proceed to the OR for a robotic left inguinal hernia repair with mesh, and open umbilical hernia repair +/- mesh °2. All risks and benefits were discussed with the patient, to generally include infection, bleeding, damage to surrounding structures, acute and chronic nerve pain, and recurrence. Alternatives were offered and described.  All questions were answered and the patient voiced understanding of the procedure and wishes to proceed at this point. °  °  °  °  °  °  °No follow-ups on file. °  °Shawn Lex, MD, FACS °[facility] °General & Minimally Invasive Surgery ° ° °  motion.  Skin:    General: Skin is warm and dry.  Neurological:     General: No focal deficit present.     Mental Status: He is alert and oriented to person, place, and time.  Psychiatric:        Mood and Affect: Mood normal.        Thought Content: Thought content normal.          Assessment and Plan: Diagnoses and all orders for this visit:   Umbilical hernia without obstruction or gangrene   Non-recurrent unilateral inguinal hernia without obstruction or gangrene     Shawn Owens is a 56 y.o. male    1.          We will proceed to the OR for a robotic left inguinal hernia repair with mesh, and open umbilical hernia repair +/- mesh 2.         All risks and benefits were discussed with the patient, to generally include infection, bleeding, damage to surrounding structures, acute and chronic nerve pain, and recurrence. Alternatives were offered and described.  All questions were answered and the patient voiced understanding of the procedure and wishes to proceed at this point.             No follow-ups on file.   Axel Filler, MD, [facility], [place] [facility]imally [facility]vasive [facility]

## 2021-03-20 NOTE — Discharge Instructions (Signed)
[facility] _______[facility]  HERNIA REPAIR: POST OP INSTRUCTIONS  Always review your discharge instruction sheet given to you by the facility where your surgery was performed. IF YOU HAVE DISABILITY OR FAMILY LEAVE FORMS, YOU MUST BRING THEM TO THE OFFICE FOR PROCESSING.   DO NOT GIVE THEM TO YOUR DOCTOR.  1. A  prescription for pain medication may be given to you upon discharge.  Take your pain medication as prescribed, if needed.  If narcotic pain medicine is not needed, then you may take acetaminophen (Tylenol) or ibuprofen (Advil) as needed. 2. Take your usually prescribed medications unless otherwise directed. If you need a refill on your pain medication, please contact your pharmacy.  They will contact our office to request authorization. Prescriptions will not be filled after 5 pm or on week-ends. 3. You should follow a light diet the first 24 hours after arrival home, such as soup and crackers, etc.  Be sure to include lots of fluids daily.  Resume your normal diet the day after surgery. 4.Most patients will experience some swelling and bruising around the umbilicus or in the groin and scrotum.  Ice packs and reclining will help.  Swelling and bruising can take several days to resolve.  6. It is common to experience some constipation if taking pain medication after surgery.  Increasing fluid intake and taking a stool softener (such as Colace) will usually help or prevent this problem from occurring.  A mild laxative (Milk of Magnesia or Miralax) should be taken according to package directions if there are no bowel movements after 48 hours. 7. Unless discharge instructions indicate otherwise, you may remove your bandages 24-48 hours after surgery, and you may shower at that time.  You may have steri-strips (small skin tapes) in place directly over the incision.  These strips should be left on the skin for 7-10 days.  If your surgeon used skin glue on the incision, you may shower in  24 hours.  The glue will flake off over the next 2-3 weeks.  Any sutures or staples will be removed at the office during your follow-up visit. 8. ACTIVITIES:  You may resume regular (light) daily activities beginning the next day--such as daily self-care, walking, climbing stairs--gradually increasing activities as tolerated.  You may have sexual intercourse when it is comfortable.  Refrain from any heavy lifting or straining until approved by your doctor.  a.You may drive when you are no longer taking prescription pain medication, you can comfortably wear a seatbelt, and you can safely maneuver your car and apply brakes. b.RETURN TO WORK:   _____________________________________________  9.You should see your doctor in the office for a follow-up appointment approximately 2-3 weeks after your surgery.  Make sure that you call for this appointment within a day or two after you arrive home to insure a convenient appointment time. 10.OTHER INSTRUCTIONS: _________________________    _____________________________________  WHEN TO CALL YOUR DOCTOR: Fever over 101.0 Inability to urinate Nausea and/or vomiting Extreme swelling or bruising Continued bleeding from incision. Increased pain, redness, or drainage from the incision  The clinic staff is available to answer your questions during regular business hours.  Please don't hesitate to call and ask to speak to one of the nurses for clinical concerns.  If you have a medical emergency, go to the nearest emergency room or call 911.  A surgeon from [facility] is always on call at the hospital   [address], [address], [place], [place]    [address] ?  [address] [address], [place], [place]   [address] [phone] ? [phone] ? FAX [phone] Web site: [email or web address]  

## 2021-03-20 NOTE — Anesthesia Procedure Notes (Signed)
Procedure Name: Intubation Date/Time: 03/20/2021 10:45 AM Performed by: Thelma Comp, CRNA Pre-anesthesia Checklist: Patient identified, Emergency Drugs available, Suction available and Patient being monitored Patient Re-evaluated:Patient Re-evaluated prior to induction Oxygen Delivery Method: Circle System Utilized Preoxygenation: Pre-oxygenation with 100% oxygen Induction Type: IV induction Ventilation: Mask ventilation without difficulty Laryngoscope Size: Mac and 4 Grade View: Grade I Tube type: Oral Tube size: 7.5 mm Number of attempts: 1 Airway Equipment and Method: Stylet Placement Confirmation: ETT inserted through vocal cords under direct vision, positive ETCO2 and breath sounds checked- equal and bilateral Secured at: 22 cm Tube secured with: Tape Dental Injury: Teeth and Oropharynx as per pre-operative assessment

## 2021-03-20 NOTE — Transfer of Care (Signed)
Immediate Anesthesia Transfer of Care Note  Patient: Shawn Owens  Procedure(s) Performed: XI ROBOTIC ASSISTED LEFT INGUINAL HERNIA REPAIR (Groin) HERNIA REPAIR UMBILICAL ADULT (Abdomen) INSERTION OF MESH (Left: Groin)  Patient Location: PACU  Anesthesia Type:General  Level of Consciousness: drowsy and patient cooperative  Airway & Oxygen Therapy: Patient Spontanous Breathing and Patient connected to face mask oxygen  Post-op Assessment: Report given to RN and Post -op Vital signs reviewed and stable  Post vital signs: Reviewed and stable  Last Vitals:  Vitals Value Taken Time  BP 134/102 03/20/21 1219  Temp    Pulse 97 03/20/21 1222  Resp 15 03/20/21 1222  SpO2 94 % 03/20/21 1222  Vitals shown include unvalidated device data.  Last Pain:  Vitals:   03/20/21 0916  TempSrc:   PainSc: 0-No pain         Complications: No notable events documented.

## 2021-03-20 NOTE — Anesthesia Postprocedure Evaluation (Signed)
Anesthesia Post Note  Patient: Shawn Owens  Procedure(s) Performed: XI ROBOTIC ASSISTED LEFT INGUINAL HERNIA REPAIR (Groin) HERNIA REPAIR UMBILICAL ADULT (Abdomen) INSERTION OF MESH (Left: Groin)     Patient location during evaluation: PACU Anesthesia Type: General Level of consciousness: awake and alert Pain management: pain level controlled Vital Signs Assessment: post-procedure vital signs reviewed and stable Respiratory status: spontaneous breathing, nonlabored ventilation and respiratory function stable Cardiovascular status: blood pressure returned to baseline and stable Postop Assessment: no apparent nausea or vomiting Anesthetic complications: no   No notable events documented.  Last Vitals:  Vitals:   03/20/21 1235 03/20/21 1250  BP: (!) 148/93 (!) 146/102  Pulse: 98 96  Resp: 17 18  Temp:  36.6 C  SpO2: 96% 93%    Last Pain:  Vitals:   03/20/21 1250  TempSrc:   PainSc: 0-No pain                 Ahijah Devery,W. EDMOND

## 2021-03-20 NOTE — Op Note (Signed)
03/20/2021  11:59 AM  PATIENT:  Shawn Owens  56 y.o. male  PRE-OPERATIVE DIAGNOSIS:  LEFT INGUINAL HERNIA UMBILICAL HERNIA  POST-OPERATIVE DIAGNOSIS:  INCARCERATED LEFT INDIRECT INGUINAL HERNIA, UMBILICAL HERNIA  PROCEDURE:  Procedure(s): XI ROBOTIC ASSISTED LEFT INGUINAL HERNIA REPAIR INSERTION OF MESH (Left) TAPP PRIMARY HERNIA REPAIR UMBILICAL ADULT (N/A)  SURGEON:  Surgeon(s) and Role:    * Axel Filler, MD - Primary  ASSISTANTS: Berenda Morale, RNFA   ANESTHESIA:   local and general  EBL:  minimal   BLOOD ADMINISTERED:none  DRAINS: none   LOCAL MEDICATIONS USED:  BUPIVICAINE   SPECIMEN:  No Specimen  DISPOSITION OF SPECIMEN:  N/A  COUNTS:  YES  TOURNIQUET:  * No tourniquets in log *  DICTATION: .Dragon Dictation  Details of the procedure:  Findings: large incarcerated indirect hernia, 2cm umbilical hernia  The patient was taken back to the operating room. The patient was placed in supine position with bilateral SCDs in place.  A Foley catheter was placed.  The patient was prepped and draped in the usual sterile fashion.  After appropriate anitbiotics were confirmed, a time-out was confirmed and all facts were verified.  At this time a Veress needle technique was used to inspect the abdomen approximately 10 cm from the valgus and the paramedian line. This time a 8 mm robotic trocar was placed into the abdomen. The camera was placed there was no injury to any intra-abdominal organs. A 74mm umbilical port was placed just superior to the umbilicus. An 8 mm port was placed approximately 10 cm lateral to the umbilicus on the left paramedian side.   Robot was positioned over the patient and the ports were docked in the usual fashion.  At this time the left-sided peritoneum was taken down from the medial umbilical ligament laterally. The pre-peritoneal space was entered. Dissection was taken down to Cooper's ligament. At this time it was apparent there was an large  incarcerated indirect hernia. At this time proceeded clean out the rest Cooper's ligament and the medial to lateral direction. I proceeded laterally to dissect the spermatic cord from the hernia sac.  The hernia sac was down to the inguinal canal.  This was dissected away from the spermatic cord.  The vas deferens was identified and protected all portion of the case.  This was dissected back. At this time I proceeded to create a pocket laterally for the mesh. Once the pocket was created the peritoneum was stripped back to approximately the base of the cord. At this time the piece of Progrip 16x12cm mesh was in placed into the dissected area. This covered both the direct and indirect spaces appropriately. This also covered  The femoral space.  The mesh lay flat from medial to lateral. At this time a 2-0 V- lock stitch was used to close the peritoneum in a standard running fashion.  At this time the robot was undocked.  The umbilical hernia was then dissected away from the umbilical stalk.  The umbilical hernia was approximately 2 cm.  This was primarily repaired using 0 Ethibond in interrupted fashion.  The umbilical stalk was then reapproximated to the fascia.   All ports were removed. The skin was reapproximated and all port sites using 4-0 Monocryl subcuticular fashion.  The patient tolerated the procedure well was taken to the recovery      PLAN OF CARE: Discharge to home after PACU  PATIENT DISPOSITION:  PACU - hemodynamically stable.   Delay start of Pharmacological VTE agent (>24hrs)  due to surgical blood loss or risk of bleeding: not applicable

## 2021-03-20 NOTE — Anesthesia Preprocedure Evaluation (Addendum)
Anesthesia Evaluation  Patient identified by MRN, date of birth, ID band Patient awake    Reviewed: Allergy & Precautions, H&P , NPO status , Patient's Chart, lab work & pertinent test results  Airway Mallampati: II  TM Distance: >3 FB Neck ROM: Full    Dental no notable dental hx. (+) Teeth Intact, Dental Advisory Given   Pulmonary asthma ,    Pulmonary exam normal breath sounds clear to auscultation       Cardiovascular hypertension, Pt. on medications  Rhythm:Regular Rate:Normal     Neuro/Psych Anxiety negative neurological ROS     GI/Hepatic Neg liver ROS, GERD  Medicated,  Endo/Other  negative endocrine ROS  Renal/GU negative Renal ROS  negative genitourinary   Musculoskeletal  (+) Arthritis , Osteoarthritis,    Abdominal   Peds  Hematology negative hematology ROS (+)   Anesthesia Other Findings   Reproductive/Obstetrics negative OB ROS                            Anesthesia Physical Anesthesia Plan  ASA: 2  Anesthesia Plan: General   Post-op Pain Management:    Induction: Intravenous  PONV Risk Score and Plan: 3 and Ondansetron, Dexamethasone and Midazolam  Airway Management Planned: Oral ETT  Additional Equipment:   Intra-op Plan:   Post-operative Plan: Extubation in OR  Informed Consent: I have reviewed the patients History and Physical, chart, labs and discussed the procedure including the risks, benefits and alternatives for the proposed anesthesia with the patient or authorized representative who has indicated his/her understanding and acceptance.     Dental advisory given  Plan Discussed with: CRNA  Anesthesia Plan Comments:         Anesthesia Quick Evaluation

## 2021-03-21 ENCOUNTER — Encounter (HOSPITAL_COMMUNITY): Payer: Self-pay | Admitting: General Surgery

## 2021-06-03 DIAGNOSIS — S92009A Unspecified fracture of unspecified calcaneus, initial encounter for closed fracture: Secondary | ICD-10-CM

## 2021-06-03 HISTORY — DX: Unspecified fracture of unspecified calcaneus, initial encounter for closed fracture: S92.009A

## 2021-06-13 ENCOUNTER — Encounter (HOSPITAL_BASED_OUTPATIENT_CLINIC_OR_DEPARTMENT_OTHER): Payer: Self-pay | Admitting: Orthopedic Surgery

## 2021-06-13 ENCOUNTER — Other Ambulatory Visit (HOSPITAL_COMMUNITY): Payer: Self-pay | Admitting: Orthopedic Surgery

## 2021-06-14 ENCOUNTER — Other Ambulatory Visit: Payer: Self-pay

## 2021-06-14 ENCOUNTER — Ambulatory Visit (HOSPITAL_BASED_OUTPATIENT_CLINIC_OR_DEPARTMENT_OTHER): Payer: Commercial Managed Care - HMO

## 2021-06-14 ENCOUNTER — Encounter (HOSPITAL_BASED_OUTPATIENT_CLINIC_OR_DEPARTMENT_OTHER)
Admission: RE | Disposition: A | Discharge status: Home / Self Care | Payer: Self-pay | Source: Home / Self Care | Attending: Orthopedic Surgery

## 2021-06-14 ENCOUNTER — Ambulatory Visit (HOSPITAL_BASED_OUTPATIENT_CLINIC_OR_DEPARTMENT_OTHER)
Admission: RE | Admit: 2021-06-14 | Discharge: 2021-06-14 | Disposition: A | Discharge status: Home / Self Care | Payer: Commercial Managed Care - HMO | Attending: Orthopedic Surgery | Admitting: Orthopedic Surgery

## 2021-06-14 ENCOUNTER — Ambulatory Visit (HOSPITAL_BASED_OUTPATIENT_CLINIC_OR_DEPARTMENT_OTHER): Payer: Commercial Managed Care - HMO | Admitting: Anesthesiology

## 2021-06-14 ENCOUNTER — Encounter (HOSPITAL_BASED_OUTPATIENT_CLINIC_OR_DEPARTMENT_OTHER): Payer: Self-pay | Admitting: Orthopedic Surgery

## 2021-06-14 DIAGNOSIS — S92142A Displaced dome fracture of left talus, initial encounter for closed fracture: Secondary | ICD-10-CM | POA: Insufficient documentation

## 2021-06-14 DIAGNOSIS — S92062A Displaced intraarticular fracture of left calcaneus, initial encounter for closed fracture: Secondary | ICD-10-CM | POA: Diagnosis not present

## 2021-06-14 DIAGNOSIS — I10 Essential (primary) hypertension: Secondary | ICD-10-CM | POA: Insufficient documentation

## 2021-06-14 DIAGNOSIS — K219 Gastro-esophageal reflux disease without esophagitis: Secondary | ICD-10-CM | POA: Insufficient documentation

## 2021-06-14 DIAGNOSIS — J45909 Unspecified asthma, uncomplicated: Secondary | ICD-10-CM | POA: Diagnosis not present

## 2021-06-14 DIAGNOSIS — W11XXXA Fall on and from ladder, initial encounter: Secondary | ICD-10-CM | POA: Diagnosis not present

## 2021-06-14 HISTORY — PX: ORIF CALCANEOUS FRACTURE: SHX5030

## 2021-06-14 SURGERY — OPEN REDUCTION INTERNAL FIXATION (ORIF) CALCANEOUS FRACTURE
Anesthesia: General | Laterality: Left

## 2021-06-14 MED ORDER — VANCOMYCIN HCL 500 MG IV SOLR
INTRAVENOUS | Status: DC | PRN
Start: 2021-06-14 — End: 2021-06-14
  Administered 2021-06-14: 500 mg via TOPICAL

## 2021-06-14 MED ORDER — SODIUM CHLORIDE 0.9 % IV SOLN: INTRAVENOUS | Status: DC

## 2021-06-14 MED ORDER — OXYCODONE HCL 5 MG PO TABS: 5.0000 mg | ORAL_TABLET | Freq: Four times a day (QID) | ORAL | 0 refills | Status: AC | PRN

## 2021-06-14 MED ORDER — MIDAZOLAM HCL 5 MG/5ML IJ SOLN
INTRAMUSCULAR | Status: DC | PRN
Start: 2021-06-14 — End: 2021-06-14
  Administered 2021-06-14: 2 mg via INTRAVENOUS

## 2021-06-14 MED ORDER — BUPIVACAINE-EPINEPHRINE (PF) 0.5% -1:200000 IJ SOLN
INTRAMUSCULAR | Status: DC | PRN
  Administered 2021-06-14: 10 mL via PERINEURAL
  Administered 2021-06-14: 30 mL via PERINEURAL

## 2021-06-14 MED ORDER — MIDAZOLAM HCL 2 MG/2ML IJ SOLN
INTRAMUSCULAR | Status: AC
  Filled 2021-06-14: qty 2

## 2021-06-14 MED ORDER — FENTANYL CITRATE (PF) 100 MCG/2ML IJ SOLN
100.0000 ug | Freq: Once | INTRAMUSCULAR | Status: AC
  Administered 2021-06-14: 100 ug via INTRAVENOUS

## 2021-06-14 MED ORDER — LACTATED RINGERS IV SOLN: INTRAVENOUS | Status: DC

## 2021-06-14 MED ORDER — LIDOCAINE 2% (20 MG/ML) 5 ML SYRINGE
INTRAMUSCULAR | Status: DC | PRN
  Administered 2021-06-14: 30 mg via INTRAVENOUS

## 2021-06-14 MED ORDER — MIDAZOLAM HCL 2 MG/2ML IJ SOLN
2.0000 mg | Freq: Once | INTRAMUSCULAR | Status: AC
Start: 2021-06-14 — End: 2021-06-14
  Administered 2021-06-14: 2 mg via INTRAVENOUS

## 2021-06-14 MED ORDER — MIDAZOLAM HCL 2 MG/2ML IJ SOLN
INTRAMUSCULAR | Status: AC
Start: 2021-06-14 — End: ?
  Filled 2021-06-14: qty 2

## 2021-06-14 MED ORDER — ACETAMINOPHEN 500 MG PO TABS
1000.0000 mg | ORAL_TABLET | Freq: Once | ORAL | Status: AC
Start: 2021-06-14 — End: 2021-06-14
  Administered 2021-06-14: 1000 mg via ORAL

## 2021-06-14 MED ORDER — ONDANSETRON HCL 4 MG/2ML IJ SOLN: 4.0000 mg | Freq: Once | INTRAMUSCULAR | Status: DC | PRN

## 2021-06-14 MED ORDER — ONDANSETRON HCL 4 MG/2ML IJ SOLN
INTRAMUSCULAR | Status: DC | PRN
  Administered 2021-06-14: 4 mg via INTRAVENOUS

## 2021-06-14 MED ORDER — ACETAMINOPHEN 500 MG PO TABS
ORAL_TABLET | ORAL | Status: AC
  Filled 2021-06-14: qty 2

## 2021-06-14 MED ORDER — PROPOFOL 500 MG/50ML IV EMUL
INTRAVENOUS | Status: DC | PRN
  Administered 2021-06-14: 25 ug/kg/min via INTRAVENOUS

## 2021-06-14 MED ORDER — CEFAZOLIN SODIUM-DEXTROSE 2-4 GM/100ML-% IV SOLN
2.0000 g | INTRAVENOUS | Status: AC
  Administered 2021-06-14: 2 g via INTRAVENOUS

## 2021-06-14 MED ORDER — FENTANYL CITRATE (PF) 100 MCG/2ML IJ SOLN: 25.0000 ug | INTRAMUSCULAR | Status: DC | PRN

## 2021-06-14 MED ORDER — AMISULPRIDE (ANTIEMETIC) 5 MG/2ML IV SOLN
INTRAVENOUS | Status: AC
  Filled 2021-06-14: qty 4

## 2021-06-14 MED ORDER — AMISULPRIDE (ANTIEMETIC) 5 MG/2ML IV SOLN
10.0000 mg | Freq: Once | INTRAVENOUS | Status: AC
  Administered 2021-06-14: 10 mg via INTRAVENOUS

## 2021-06-14 MED ORDER — FENTANYL CITRATE (PF) 100 MCG/2ML IJ SOLN
INTRAMUSCULAR | Status: AC
  Filled 2021-06-14: qty 2

## 2021-06-14 MED ORDER — CEFAZOLIN SODIUM-DEXTROSE 2-4 GM/100ML-% IV SOLN
INTRAVENOUS | Status: AC
Start: 2021-06-14 — End: ?
  Filled 2021-06-14: qty 100

## 2021-06-14 MED ORDER — PROPOFOL 10 MG/ML IV BOLUS
INTRAVENOUS | Status: DC | PRN
  Administered 2021-06-14: 200 mg via INTRAVENOUS

## 2021-06-14 MED ORDER — DEXAMETHASONE SODIUM PHOSPHATE 10 MG/ML IJ SOLN
INTRAMUSCULAR | Status: DC | PRN
  Administered 2021-06-14: 4 mg via INTRAVENOUS

## 2021-06-14 MED ORDER — 0.9 % SODIUM CHLORIDE (POUR BTL) OPTIME
TOPICAL | Status: DC | PRN
  Administered 2021-06-14: 100 mL

## 2021-06-14 SURGICAL SUPPLY — 69 items
APL PRP STRL LF DISP 70% ISPRP (MISCELLANEOUS) ×1
BANDAGE ESMARK 6X9 LF (GAUZE/BANDAGES/DRESSINGS) IMPLANT
BLADE SURG 15 STRL LF DISP TIS (BLADE) ×2 IMPLANT
BLADE SURG 15 STRL SS (BLADE) ×4
BNDG CMPR 9X6 STRL LF SNTH (GAUZE/BANDAGES/DRESSINGS)
BNDG COHESIVE 4X5 TAN ST LF (GAUZE/BANDAGES/DRESSINGS) ×2 IMPLANT
BNDG COHESIVE 6X5 TAN ST LF (GAUZE/BANDAGES/DRESSINGS) ×2 IMPLANT
BNDG ESMARK 6X9 LF (GAUZE/BANDAGES/DRESSINGS)
CHLORAPREP W/TINT 26 (MISCELLANEOUS) ×2 IMPLANT
COVER BACK TABLE 60X90IN (DRAPES) ×2 IMPLANT
CUFF TOURN SGL QUICK 34 (TOURNIQUET CUFF) ×2
CUFF TRNQT CYL 34X4.125X (TOURNIQUET CUFF) ×1 IMPLANT
DRAPE EXTREMITY T 121X128X90 (DISPOSABLE) ×2 IMPLANT
DRAPE OEC MINIVIEW 54X84 (DRAPES) ×2 IMPLANT
DRAPE U-SHAPE 47X51 STRL (DRAPES) ×2 IMPLANT
DRSG MEPITEL 4X7.2 (GAUZE/BANDAGES/DRESSINGS) ×2 IMPLANT
DRSG PAD ABDOMINAL 8X10 ST (GAUZE/BANDAGES/DRESSINGS) ×4 IMPLANT
ELECT REM PT RETURN 9FT ADLT (ELECTROSURGICAL) ×2
ELECTRODE REM PT RTRN 9FT ADLT (ELECTROSURGICAL) ×1 IMPLANT
GAUZE SPONGE 4X4 12PLY STRL (GAUZE/BANDAGES/DRESSINGS) ×2 IMPLANT
GLOVE BIO SURGEON STRL SZ7 (GLOVE) ×1 IMPLANT
GLOVE BIO SURGEON STRL SZ8 (GLOVE) ×2 IMPLANT
GLOVE BIOGEL PI IND STRL 7.0 (GLOVE) IMPLANT
GLOVE BIOGEL PI IND STRL 8 (GLOVE) ×2 IMPLANT
GLOVE BIOGEL PI INDICATOR 7.0 (GLOVE) ×1
GLOVE BIOGEL PI INDICATOR 8 (GLOVE) ×1
GLOVE ECLIPSE 8.0 STRL XLNG CF (GLOVE) ×1 IMPLANT
GOWN STRL REUS W/ TWL LRG LVL3 (GOWN DISPOSABLE) ×1 IMPLANT
GOWN STRL REUS W/ TWL XL LVL3 (GOWN DISPOSABLE) ×2 IMPLANT
GOWN STRL REUS W/TWL LRG LVL3 (GOWN DISPOSABLE) ×2
GOWN STRL REUS W/TWL XL LVL3 (GOWN DISPOSABLE) ×2
K-WIRE ACE 1.6X6 (WIRE) ×12
KWIRE ACE 1.6X6 (WIRE) IMPLANT
NS IRRIG 1000ML POUR BTL (IV SOLUTION) ×2 IMPLANT
PACK BASIN DAY SURGERY FS (CUSTOM PROCEDURE TRAY) ×2 IMPLANT
PAD CAST 4YDX4 CTTN HI CHSV (CAST SUPPLIES) ×1 IMPLANT
PADDING CAST ABS 4INX4YD NS (CAST SUPPLIES) ×1
PADDING CAST ABS COTTON 4X4 ST (CAST SUPPLIES) ×1 IMPLANT
PADDING CAST COTTON 4X4 STRL (CAST SUPPLIES) ×2
PADDING CAST COTTON 6X4 STRL (CAST SUPPLIES) ×2 IMPLANT
PENCIL SMOKE EVACUATOR (MISCELLANEOUS) ×2 IMPLANT
PLATE MIS LOCK CALC LRG LT (Plate) ×1 IMPLANT
SANITIZER HAND PURELL 535ML FO (MISCELLANEOUS) ×2 IMPLANT
SCREW CORTICAL LOW PROF 3.5X32 (Screw) ×1 IMPLANT
SCREW T15 LP CORT 3.5X36MM NS (Screw) ×2 IMPLANT
SCREW T15 LP CORT 3.5X38MM NS (Screw) ×1 IMPLANT
SCREW T15 LP CORT 3.5X40MM NS (Screw) ×2 IMPLANT
SCREW T15 LP CORT 3.5X42MM NS (Screw) ×1 IMPLANT
SCREW T15 LP CORT 3.5X44MM NS (Screw) ×2 IMPLANT
SHEET MEDIUM DRAPE 40X70 STRL (DRAPES) ×2 IMPLANT
SLEEVE SCD COMPRESS KNEE MED (STOCKING) ×2 IMPLANT
SPIKE FLUID TRANSFER (MISCELLANEOUS) IMPLANT
SPLINT FAST PLASTER 5X30 (CAST SUPPLIES) ×20
SPLINT PLASTER CAST FAST 5X30 (CAST SUPPLIES) ×20 IMPLANT
SPONGE T-LAP 18X18 ~~LOC~~+RFID (SPONGE) ×2 IMPLANT
STOCKINETTE 6  STRL (DRAPES) ×1
STOCKINETTE 6 STRL (DRAPES) ×1 IMPLANT
SUCTION FRAZIER HANDLE 10FR (MISCELLANEOUS) ×1
SUCTION TUBE FRAZIER 10FR DISP (MISCELLANEOUS) ×1 IMPLANT
SUT ETHILON 3 0 PS 1 (SUTURE) ×2 IMPLANT
SUT MNCRL AB 3-0 PS2 18 (SUTURE) ×2 IMPLANT
SUT VIC AB 2-0 SH 18 (SUTURE) IMPLANT
SUT VIC AB 2-0 SH 27 (SUTURE)
SUT VIC AB 2-0 SH 27XBRD (SUTURE) IMPLANT
SUT VICRYL 0 SH 27 (SUTURE) ×2 IMPLANT
SYR BULB EAR ULCER 3OZ GRN STR (SYRINGE) ×2 IMPLANT
TOWEL GREEN STERILE FF (TOWEL DISPOSABLE) ×5 IMPLANT
TUBE CONNECTING 20X1/4 (TUBING) ×2 IMPLANT
UNDERPAD 30X36 HEAVY ABSORB (UNDERPADS AND DIAPERS) ×2 IMPLANT

## 2021-06-14 NOTE — Transfer of Care (Signed)
Immediate Anesthesia Transfer of Care Note ? ?Patient: Shawn Owens ? ?Procedure(s) Performed: OPEN REDUCTION INTERNAL FIXATION (ORIF) CALCANEOUS FRACTURE (Left) ? ?Patient Location: PACU ? ?Anesthesia Type:General and regional ? ?Level of Consciousness: awake, drowsy and patient cooperative ? ?Airway & Oxygen Therapy: Patient Spontanous Breathing and Patient connected to face mask oxygen ? ?Post-op Assessment: Report given to RN and Post -op Vital signs reviewed and stable ? ?Post vital signs: Reviewed and stable ? ?Last Vitals:  ?Vitals Value Taken Time  ?BP 151/96 06/14/21 1742  ?Temp    ?Pulse 106 06/14/21 1743  ?Resp 15 06/14/21 1743  ?SpO2 96 % 06/14/21 1743  ?Vitals shown include unvalidated device data. ? ?Last Pain:  ?Vitals:  ? 06/14/21 1239  ?TempSrc: Oral  ?PainSc: 2   ?   ? ?Patients Stated Pain Goal: 5 (06/14/21 1239) ? ?Complications: No notable events documented. ?

## 2021-06-14 NOTE — Anesthesia Preprocedure Evaluation (Signed)
Anesthesia Evaluation  ?Patient identified by MRN, date of birth, ID band ?Patient awake ? ? ? ?Reviewed: ?Allergy & Precautions, NPO status , Patient's Chart, lab work & pertinent test results ? ?Airway ?Mallampati: II ? ?TM Distance: >3 FB ?Neck ROM: Full ? ? ? Dental ? ?(+) Teeth Intact, Dental Advisory Given ?  ?Pulmonary ?asthma ,  ?  ?Pulmonary exam normal ?breath sounds clear to auscultation ? ? ? ? ? ? Cardiovascular ?hypertension, Pt. on medications ?Normal cardiovascular exam ?Rhythm:Regular Rate:Normal ? ? ?  ?Neuro/Psych ?PSYCHIATRIC DISORDERS Anxiety negative neurological ROS ?   ? GI/Hepatic ?Neg liver ROS, GERD  Medicated,  ?Endo/Other  ?Obesity ? ? Renal/GU ?negative Renal ROS  ? ?  ?Musculoskeletal ? ?(+) Arthritis , displaced intrarticular fracture of left calcaneus  ? Abdominal ?  ?Peds ? Hematology ?negative hematology ROS ?(+)   ?Anesthesia Other Findings ?Day of surgery medications reviewed with the patient. ? Reproductive/Obstetrics ? ?  ? ? ? ? ? ? ? ? ? ? ? ? ? ?  ?  ? ? ? ? ? ? ? ? ?Anesthesia Physical ?Anesthesia Plan ? ?ASA: 2 ? ?Anesthesia Plan: General  ? ?Post-op Pain Management: Regional block* and Tylenol PO (pre-op)*  ? ?Induction: Intravenous ? ?PONV Risk Score and Plan: 2 and Midazolam, Dexamethasone and Ondansetron ? ?Airway Management Planned: LMA ? ?Additional Equipment:  ? ?Intra-op Plan:  ? ?Post-operative Plan: Extubation in OR ? ?Informed Consent: I have reviewed the patients History and Physical, chart, labs and discussed the procedure including the risks, benefits and alternatives for the proposed anesthesia with the patient or authorized representative who has indicated his/her understanding and acceptance.  ? ? ? ?Dental advisory given ? ?Plan Discussed with: CRNA ? ?Anesthesia Plan Comments:   ? ? ? ? ? ? ?Anesthesia Quick Evaluation ? ?

## 2021-06-14 NOTE — Anesthesia Procedure Notes (Signed)
Procedure Name: LMA Insertion ?Date/Time: 06/14/2021 3:39 PM ?Performed by: Ralpheal Zappone, Jewel Baize, CRNA ?Pre-anesthesia Checklist: Patient identified, Emergency Drugs available, Suction available and Patient being monitored ?Patient Re-evaluated:Patient Re-evaluated prior to induction ?Oxygen Delivery Method: Circle System Utilized ?Preoxygenation: Pre-oxygenation with 100% oxygen ?Induction Type: IV induction ?Ventilation: Mask ventilation without difficulty ?LMA: LMA inserted ?LMA Size: 4.0 ?Number of attempts: 1 ?Airway Equipment and Method: bite block ?Placement Confirmation: positive ETCO2 ?Tube secured with: Tape ?Dental Injury: Teeth and Oropharynx as per pre-operative assessment  ? ? ? ? ?

## 2021-06-14 NOTE — Anesthesia Procedure Notes (Signed)
Anesthesia Regional Block: Adductor canal block  ? ?Pre-Anesthetic Checklist: , timeout performed,  Correct Patient, Correct Site, Correct Laterality,  Correct Procedure, Correct Position, site marked,  Risks and benefits discussed,  Surgical consent,  Pre-op evaluation,  At surgeon's request and post-op pain management ? ?Laterality: Left ? ?Prep: chloraprep     ?  ?Needles:  ?Injection technique: Single-shot ? ?Needle Type: Echogenic Needle   ? ? ?Needle Length: 9cm  ?Needle Gauge: 21  ? ? ? ?Additional Needles: ? ? ?Procedures:,,,, ultrasound used (permanent image in chart),,    ?Narrative:  ?Start time: 06/14/2021 2:06 PM ?End time: 06/14/2021 2:10 PM ?Injection made incrementally with aspirations every 5 mL. ? ?Performed by: Personally  ?Anesthesiologist: Santa Lighter, MD ? ?Additional Notes: ?No pain on injection. No increased resistance to injection. Injection made in 5cc increments.  Good needle visualization.  Patient tolerated procedure well. ? ? ? ? ?

## 2021-06-14 NOTE — Anesthesia Postprocedure Evaluation (Signed)
Anesthesia Post Note ? ?Patient: Shawn Owens ? ?Procedure(s) Performed: OPEN REDUCTION INTERNAL FIXATION (ORIF) CALCANEOUS FRACTURE (Left) ? ?  ? ?Patient location during evaluation: PACU ?Anesthesia Type: General and Regional ?Level of consciousness: awake and alert ?Pain management: pain level controlled ?Vital Signs Assessment: post-procedure vital signs reviewed and stable ?Respiratory status: spontaneous breathing, nonlabored ventilation and respiratory function stable ?Cardiovascular status: blood pressure returned to baseline and stable ?Postop Assessment: no apparent nausea or vomiting ?Anesthetic complications: no ? ? ?No notable events documented. ? ?Last Vitals:  ?Vitals:  ? 06/14/21 1742 06/14/21 1745  ?BP: (!) 151/96 (!) 152/91  ?Pulse: (!) 107 (!) 102  ?Resp: 15 15  ?Temp: 37 ?C   ?SpO2: 97% 98%  ?  ?Last Pain:  ?Vitals:  ? 06/14/21 1745  ?TempSrc:   ?PainSc: Asleep  ? ? ?  ?  ?  ?  ?  ?  ? ?Constance Whittle,W. EDMOND ? ? ? ? ?

## 2021-06-14 NOTE — H&P (Signed)
Shawn Owens is an 56 y.o. male.   ?Chief Complaint: left foot pain ?HPI: 56 year old male without significant past medical history fell from a ladder last week injuring his left foot.  He has a comminuted fracture of his calcaneus.  He presents now for operative treatment of this displaced intra-articular hindfoot fracture. ? ?Past Medical History:  ?Diagnosis Date  ? Anxiety   ? Arthritis   ? knees and hips  ? Asthma   ? Closed fracture of calcaneus 06/03/2021  ? left  ? GERD (gastroesophageal reflux disease)   ? Hypertension   ? Pneumonia   ? hx of about 3 or 4 times in his lifetime, per pt  ? ? ?Past Surgical History:  ?Procedure Laterality Date  ? APPENDECTOMY  1977  ? HERNIA REPAIR    ? INSERTION OF MESH Left 03/20/2021  ? Procedure: INSERTION OF MESH;  Surgeon: Ralene Ok, MD;  Location: [facility];  Service: General;  Laterality: Left;  ? UMBILICAL HERNIA REPAIR N/A 03/20/2021  ? Procedure: HERNIA REPAIR UMBILICAL ADULT;  Surgeon: Ralene Ok, MD;  Location: [facility];  Service: General;  Laterality: N/A;  ? ? ?History reviewed. No pertinent family history. ?Social History:  reports that he has never smoked. He has never used smokeless tobacco. He reports that he does not drink alcohol and does not use drugs. ? ?Allergies: No Known Allergies ? ?Medications Prior to Admission  ?Medication Sig Dispense Refill  ? acetaminophen (TYLENOL) 325 MG tablet Take 325 mg by mouth every 4 (four) hours as needed.    ? acyclovir (ZOVIRAX) 400 MG tablet Take 400 mg by mouth daily.    ? albuterol (VENTOLIN HFA) 108 (90 Base) MCG/ACT inhaler Inhale 2 puffs into the lungs every 6 (six) hours as needed for wheezing or shortness of breath.    ? ALPRAZolam (XANAX) 0.5 MG tablet Take 0.5 mg by mouth daily.    ? famotidine (PEPCID) 20 MG tablet Take 20 mg by mouth daily.    ? fluticasone (FLOVENT HFA) 220 MCG/ACT inhaler Inhale 1 puff into the lungs daily.    ? ibuprofen (ADVIL) 600 MG tablet Take 600 mg by mouth every 6 (six)  hours as needed.    ? loratadine (CLARITIN) 10 MG tablet Take 10 mg by mouth daily.    ? losartan (COZAAR) 50 MG tablet Take 50 mg by mouth daily.    ? omeprazole (PRILOSEC) 20 MG capsule Take 20 mg by mouth daily.    ? oxyCODONE (OXY IR/ROXICODONE) 5 MG immediate release tablet Take 5 mg by mouth every 4 (four) hours as needed for severe pain.    ? ? ?No results found for this or any previous visit (from the past 48 hour(s)). ?DG MINI C-ARM IMAGE ONLY ? ?Result Date: 06/14/2021 ?There is no interpretation for this exam.  This order is for images obtained during a surgical procedure.  Please See "Surgeries" Tab for more information regarding the procedure.   ? ?Review of Systems no recent fever, chills, nausea, vomiting or changes in his appetite ? ?Blood pressure (!) 144/97, pulse 90, resp. rate 15, height 5\' 10"  (1.778 m), weight 99.3 kg, SpO2 94 %. ?Physical Exam  ?Well-nourished well-developed man in no apparent distress.  Alert and oriented.  Normal mood and affect.  Left foot has some swelling.  Skin is otherwise intact.  Intact sensibility to light touch in the medial and lateral plantar nerve distributions.  No lymphadenopathy.  Pulses are palpable in the foot. ? ? ?  Assessment/Plan ?Comminuted intra-articular fracture of the left calcaneus -to the operating room today for open treatment with internal fixation.  The risks and benefits of the alternative treatment options have been discussed in detail.  The patient wishes to proceed with surgery and specifically understands risks of bleeding, infection, nerve damage, blood clots, need for additional surgery, amputation and death.  ? ?Wylene Simmer, MD ?Jul 07, 2021, 2:57 PM ? ? ? ?

## 2021-06-14 NOTE — Discharge Instructions (Addendum)
Shawn Arthurs, MD ?[facility] ? ?Please read the following information regarding your care after surgery. ? ?Medications  ?You only need a prescription for the narcotic pain medicine (ex. oxycodone, Percocet, Norco).  All of the other medicines listed below are available over the counter. ?X Aleve 2 pills twice a day for the first 3 days after surgery. ?X acetominophen (Tylenol) 650 mg every 4-6 hours as you need for minor to moderate pain ?X oxycodone as prescribed for severe pain ? ?Narcotic pain medicine (ex. oxycodone, Percocet, Vicodin) will cause constipation.  To prevent this problem, take the following medicines while you are taking any pain medicine. ?X docusate sodium (Colace) 100 mg twice a day X senna (Senokot) 2 tablets twice a day ? ?X To help prevent blood clots, take a baby aspirin (81 mg) twice a day for two weeks after surgery.  You should also get up every hour while you are awake to move around.   ? ?Weight Bearing ?? Bear weight when you are able on your operated leg or foot. ?? Bear weight only on your operated foot in the post-op shoe. ?X Do not bear any weight on the operated leg or foot. ? ?Cast / Splint / Dressing ?X Keep your splint, cast or dressing clean and dry.  Don?t put anything (coat hanger, pencil, etc) down inside of it.  If it gets damp, use a hair dryer on the cool setting to dry it.  If it gets soaked, call the office to schedule an appointment for a cast change. ?? Remove your dressing 3 days after surgery and cover the incisions with dry dressings.   ? ?After your dressing, cast or splint is removed; you may shower, but do not soak or scrub the wound.  Allow the water to run over it, and then gently pat it dry. ? ?Swelling ?It is normal for you to have swelling where you had surgery.  To reduce swelling and pain, keep your toes above your nose for at least 3 days after surgery.  It may be necessary to keep your foot or leg elevated for several weeks.  If it hurts, it should be  elevated. ? ?Follow Up ?Call my office at [phone] when you are discharged from the hospital or surgery center to schedule an appointment to be seen two weeks after surgery. ? ?Call my office at [phone] if you develop a fever >101.5? F, nausea, vomiting, bleeding from the surgical site or severe pain.   ?  ? ?Regional Anesthesia Blocks ? ?1. Numbness or the inability to move the "blocked" extremity may last from 3-48 hours after placement. The length of time depends on the medication injected and your individual response to the medication. If the numbness is not going away after 48 hours, call your surgeon. ? ?2. The extremity that is blocked will need to be protected until the numbness is gone and the  Strength has returned. Because you cannot feel it, you will need to take extra care to avoid injury. Because it may be weak, you may have difficulty moving it or using it. You may not know what position it is in without looking at it while the block is in effect. ? ?3. For blocks in the legs and feet, returning to weight bearing and walking needs to be done carefully. You will need to wait until the numbness is entirely gone and the strength has returned. You should be able to move your leg and foot normally before you try  and bear weight or walk. You will need someone to be with you when you first try to ensure you do not fall and possibly risk injury. ? ?4. Bruising and tenderness at the needle site are common side effects and will resolve in a few days. ? ?5. Persistent numbness or new problems with movement should be communicated to the surgeon or the [facility] [phone] [facility] [phone]).  ? ?Post Anesthesia Home Care Instructions ? ?Activity: ?Get plenty of rest for the remainder of the day. A responsible individual must stay with you for 24 hours following the procedure.  ?For the next 24 hours, DO NOT: ?-Drive a car ?-Advertising copywriter ?-Drink alcoholic  beverages ?-Take any medication unless instructed by your physician ?-Make any legal decisions or sign important papers. ? ?Meals: ?Start with liquid foods such as gelatin or soup. Progress to regular foods as tolerated. Avoid greasy, spicy, heavy foods. If nausea and/or vomiting occur, drink only clear liquids until the nausea and/or vomiting subsides. Call your physician if vomiting continues. ? ?Special Instructions/Symptoms: ?Your throat may feel dry or sore from the anesthesia or the breathing tube placed in your throat during surgery. If this causes discomfort, gargle with warm salt water. The discomfort should disappear within 24 hours. ? ?If you had a scopolamine patch placed behind your ear for the management of post- operative nausea and/or vomiting: ? ?1. The medication in the patch is effective for 72 hours, after which it should be removed.  Wrap patch in a tissue and discard in the trash. Wash hands thoroughly with soap and water. ?2. You may remove the patch earlier than 72 hours if you experience unpleasant side effects which may include dry mouth, dizziness or visual disturbances. ?3. Avoid touching the patch. Wash your hands with soap and water after contact with the patch. ?    ?

## 2021-06-14 NOTE — Op Note (Signed)
06/14/2021 ? ?5:45 PM ? ?PATIENT:  Shawn Owens  56 y.o. male ? ?PRE-OPERATIVE DIAGNOSIS:  1.  displaced intrarticular fracture of left calcaneus ?     2.  Left talus lateral process fracture ? ?POST-OPERATIVE DIAGNOSIS:  1.  displaced intrarticular fracture of left calcaneus ?     2.  Left talus lateral process fracture ? ?Procedure(s): ? OPEN REDUCTION INTERNAL FIXATION (ORIF) CALCANEUS FRACTURE ?Left talus exostectomy ?Lateral, harris heel and Azalia Bilis of the left foot ? ?SURGEON:  Toni Arthurs, MD ? ?ASSISTANT:  none ? ?ANESTHESIA:   General, regional ? ?EBL:  minimal  ? ?TOURNIQUET:   ?Total Tourniquet Time Documented: ?Thigh (Left) - 96 minutes ?Total: Thigh (Left) - 96 minutes ? ?COMPLICATIONS:  None apparent ? ?DISPOSITION:  Extubated, awake and stable to recovery. ? ?INDICATION FOR PROCEDURE:  56 y/o male without significant PMH fell from a ladder over a week ago sustaining an intra articular displaced left calcaneus fracture.  He presents now for operative treatment of this injury.  The risks and benefits of the alternative treatment options have been discussed in detail.  The patient wishes to proceed with surgery and specifically understands risks of bleeding, infection, nerve damage, blood clots, need for additional surgery, amputation and death.   ? ?PROCEDURE IN DETAIL:After pre operative consent was obtained, and the correct operative site was identified, the patient was brought to the operating room and placed supine on the OR table.  Anesthesia was administered.  Pre-operative antibiotics were administered.  A surgical timeout was taken.  The left lower extremity was prepped and draped in standard sterile fashion.  The extremity was elevated and the tourniquet was inflated to 250 mmHg.  A curvilinear incision was made over the sinus Tarsi.  Dissection was carried sharply down through the subcutaneous tissues.  The interval between the extensor digitorum brevis and peroneals was developed.  The  fracture site was identified.  Subperiosteal dissection was carried laterally along the lateral wall of the calcaneus while the peroneals were protected.  The hematoma was irrigated and debrided.  The lateral wall talus fracture was debrided of all loose fragments.  The calcaneus fracture fragments were mobilized.  A Schanz pin was placed in the tuberosity and used to pull the bone out to length.  K wires were inserted from the tuberosity to the sustentaculum tali.  The posterior facet fragments were then reduced and pinned provisionally to the sustentaculum.  The anterior process fragment was pinned to the sustentaculum as well.  Lateral and Harris heel radiographs confirmed appropriate reduction of the fractures.  Size large 3 hole [employer] MIS plate was selected.  It was inserted into the tissue beneath the skin along the lateral wall of the calcaneus.  It was provisionally pinned.  Radiographs confirmed appropriate position of the plate.  The plate was then secured to bone after predrilling with nonlocking screws.  Lateral, Broden and Harris heel views confirmed appropriate reduction of the fracture in appropriate position and length of the hardware.  The wound was then irrigated copiously.  Vancomycin powder was sprinkled in the wound.  The skin incisions were closed with 2-0 Vicryl in the subcutaneous layer and 3-0 nylon at the skin.  Sterile dressings were applied followed by a well-padded short leg splint.  The tourniquet was released after application of the dressings.  The patient was awakened from anesthesia and transported to the recovery room in stable condition. ? ? ?FOLLOW UP PLAN: Nonweightbearing on the left lower extremity.  Aspirin for DVT prophylaxis.  Follow-up in the office in 2 weeks for suture removal and conversion to a short leg cast.  Plan 2 months postoperative nonweightbearing immobilization. ? ? ?RADIOGRAPHS: Lateral, Harris heel and Broden views of the left foot were obtained  intraoperatively.  These show interval reduction and fixation of the displaced intra-articular calcaneus fracture.  Hardware is appropriately positioned and of the appropriate lengths.  No other acute injuries are noted. ? ? Alfredo Martinez PA-C was present and scrubbed for the duration of the operative case. His assistance was essential in positioning the patient, prepping and draping, gaining and maintaining exposure, performing the operation, closing and dressing the wounds and applying the splint. ? ?  ?

## 2021-06-14 NOTE — Anesthesia Procedure Notes (Signed)
Anesthesia Regional Block: Popliteal block  ? ?Pre-Anesthetic Checklist: , timeout performed,  Correct Patient, Correct Site, Correct Laterality,  Correct Procedure, Correct Position, site marked,  Risks and benefits discussed,  Surgical consent,  Pre-op evaluation,  At surgeon's request and post-op pain management ? ?Laterality: Left ? ?Prep: chloraprep     ?  ?Needles:  ?Injection technique: Single-shot ? ?Needle Type: Echogenic Needle   ? ? ?Needle Length: 9cm  ?Needle Gauge: 21  ? ? ? ?Additional Needles: ? ? ?Procedures:,,,, ultrasound used (permanent image in chart),,    ?Narrative:  ?Start time: 06/14/2021 2:00 PM ?End time: 06/14/2021 2:06 PM ?Injection made incrementally with aspirations every 5 mL. ? ?Performed by: Personally  ?Anesthesiologist: Collene Schlichter, MD ? ?Additional Notes: ?No pain on injection. No increased resistance to injection. Injection made in 5cc increments.  Good needle visualization.  Patient tolerated procedure well. ? ? ? ? ?

## 2021-06-14 NOTE — Progress Notes (Signed)
Assisted Dr. Turk with left, popliteal/saphenous, ultrasound guided block. Side rails up, monitors on throughout procedure. See vital signs in flow sheet. Tolerated Procedure well. ?

## 2021-06-26 ENCOUNTER — Encounter (HOSPITAL_BASED_OUTPATIENT_CLINIC_OR_DEPARTMENT_OTHER): Payer: Self-pay | Admitting: Orthopedic Surgery

## 2022-03-11 DIAGNOSIS — S92012D Displaced fracture of body of left calcaneus, subsequent encounter for fracture with routine healing: Secondary | ICD-10-CM | POA: Diagnosis not present

## 2022-04-16 DIAGNOSIS — E7800 Pure hypercholesterolemia, unspecified: Secondary | ICD-10-CM | POA: Diagnosis not present

## 2022-04-16 DIAGNOSIS — R7303 Prediabetes: Secondary | ICD-10-CM | POA: Diagnosis not present

## 2022-04-16 DIAGNOSIS — J309 Allergic rhinitis, unspecified: Secondary | ICD-10-CM | POA: Diagnosis not present

## 2022-04-16 DIAGNOSIS — Z79899 Other long term (current) drug therapy: Secondary | ICD-10-CM | POA: Diagnosis not present

## 2022-04-16 DIAGNOSIS — G5792 Unspecified mononeuropathy of left lower limb: Secondary | ICD-10-CM | POA: Diagnosis not present

## 2022-04-16 DIAGNOSIS — R69 Illness, unspecified: Secondary | ICD-10-CM | POA: Diagnosis not present

## 2022-04-16 DIAGNOSIS — A6000 Herpesviral infection of urogenital system, unspecified: Secondary | ICD-10-CM | POA: Diagnosis not present

## 2022-04-16 DIAGNOSIS — I10 Essential (primary) hypertension: Secondary | ICD-10-CM | POA: Diagnosis not present

## 2022-04-16 DIAGNOSIS — K219 Gastro-esophageal reflux disease without esophagitis: Secondary | ICD-10-CM | POA: Diagnosis not present

## 2022-04-16 DIAGNOSIS — Z23 Encounter for immunization: Secondary | ICD-10-CM | POA: Diagnosis not present

## 2022-04-16 DIAGNOSIS — Z1331 Encounter for screening for depression: Secondary | ICD-10-CM | POA: Diagnosis not present

## 2022-04-16 DIAGNOSIS — G4733 Obstructive sleep apnea (adult) (pediatric): Secondary | ICD-10-CM | POA: Diagnosis not present

## 2022-04-16 DIAGNOSIS — Z125 Encounter for screening for malignant neoplasm of prostate: Secondary | ICD-10-CM | POA: Diagnosis not present

## 2022-11-12 NOTE — Progress Notes (Signed)
Surgery orders requested via Epic inbox.

## 2022-11-14 NOTE — H&P (Signed)
Patient's anticipated LOS is less than 2 midnights, meeting these requirements: - Younger than 45 - Lives within 1 hour of care - Has a competent adult at home to recover with post-op recover - NO history of  - Chronic pain requiring opiods  - Diabetes  - Coronary Artery Disease  - Heart failure  - Heart attack  - Stroke  - DVT/VTE  - Cardiac arrhythmia  - Respiratory Failure/COPD  - Renal failure  - Anemia  - Advanced Liver disease     Shawn Owens is an 57 y.o. male.    Chief Complaint: right shoulder pain  HPI: Pt is a 57 y.o. male complaining of right shoulder pain for multiple months. Pain had continually increased since the beginning. X-rays in the clinic show rotator cuff tear right shoulder. Pt has tried various conservative treatments which have failed to alleviate their symptoms, including injections and therapy. Various options are discussed with the patient. Risks, benefits and expectations were discussed with the patient. Patient understand the risks, benefits and expectations and wishes to proceed with surgery.   PCP:  Lonie Peak, PA-C  D/C Plans: Home  PMH: Past Medical History:  Diagnosis Date   Anxiety    Arthritis    knees and hips   Asthma    Closed fracture of calcaneus 06/03/2021   left   GERD (gastroesophageal reflux disease)    Hypertension    Pneumonia    hx of about 3 or 4 times in his lifetime, per pt    PSH: Past Surgical History:  Procedure Laterality Date   APPENDECTOMY  1977   HERNIA REPAIR     INSERTION OF MESH Left 03/20/2021   Procedure: INSERTION OF MESH;  Surgeon: Axel Filler, MD;  Location: [facility] OR;  Service: General;  Laterality: Left;   ORIF CALCANEOUS FRACTURE Left 06/14/2021   Procedure: OPEN REDUCTION INTERNAL FIXATION (ORIF) CALCANEOUS FRACTURE;  Surgeon: Toni Arthurs, MD;  Location: [facility];  Service: Orthopedics;  Laterality: Left;   UMBILICAL HERNIA REPAIR N/A 03/20/2021   Procedure: HERNIA  REPAIR UMBILICAL ADULT;  Surgeon: Axel Filler, MD;  Location: [facility] OR;  Service: General;  Laterality: N/A;    Social History:  reports that he has never smoked. He has never used smokeless tobacco. He reports that he does not drink alcohol and does not use drugs. BMI: Estimated body mass index is 31.41 kg/m as calculated from the following:   Height as of 06/14/21: 5\' 10"  (1.778 m).   Weight as of 06/14/21: 99.3 kg.  No results found for: "ALBUMIN" Diabetes: Patient does not have a diagnosis of diabetes.     Smoking Status:   reports that he has never smoked. He has never used smokeless tobacco.    Allergies:  No Known Allergies  Medications: No current facility-administered medications for this encounter.   Current Outpatient Medications  Medication Sig Dispense Refill   acetaminophen (TYLENOL) 325 MG tablet Take 325 mg by mouth every 4 (four) hours as needed.     acyclovir (ZOVIRAX) 400 MG tablet Take 400 mg by mouth daily.     albuterol (VENTOLIN HFA) 108 (90 Base) MCG/ACT inhaler Inhale 2 puffs into the lungs every 6 (six) hours as needed for wheezing or shortness of breath.     ALPRAZolam (XANAX) 0.5 MG tablet Take 0.5 mg by mouth daily.     famotidine (PEPCID) 20 MG tablet Take 20 mg by mouth daily.     fluticasone (FLOVENT HFA) 220 MCG/ACT inhaler  Inhale 1 puff into the lungs daily.     ibuprofen (ADVIL) 600 MG tablet Take 600 mg by mouth every 6 (six) hours as needed.     loratadine (CLARITIN) 10 MG tablet Take 10 mg by mouth daily.     losartan (COZAAR) 50 MG tablet Take 50 mg by mouth daily.     omeprazole (PRILOSEC) 20 MG capsule Take 20 mg by mouth daily.      No results found for this or any previous visit (from the past 48 hour(s)). No results found.  ROS: Pain with rom of the right upper extremity  Physical Exam: Alert and oriented 57 y.o. male in no acute distress Cranial nerves 2-12 intact Cervical spine: full rom with no tenderness, nv intact  distally Chest: active breath sounds bilaterally, no wheeze rhonchi or rales Heart: regular rate and rhythm, no murmur Abd: non tender non distended with active bowel sounds Hip is stable with rom  Right shoulder pain with rom Weakness with ER and IR No rashes or edema  Assessment/Plan Assessment: right shoulder rotator cuff tear  Plan:  Patient will undergo a right shoulder cuff repair by Dr. Ranell Patrick at [facility] Risks benefits and expectations were discussed with the patient. Patient understand risks, benefits and expectations and wishes to proceed. Preoperative templating of the joint replacement has been completed, documented, and submitted to the Operating Room personnel in order to optimize intra-operative equipment management.   Alphonsa Overall PA-C, MPAS [facility] is now [employer] [address], [place], [place] [address] Phone: [phone] [email or web address] Facebook  [employer]

## 2022-11-15 NOTE — Patient Instructions (Signed)
DUE TO COVID-19 ONLY TWO VISITORS  (aged 56 and older)  ARE ALLOWED TO COME WITH YOU AND STAY IN THE WAITING ROOM ONLY DURING PRE OP AND PROCEDURE.   **NO VISITORS ARE ALLOWED IN THE SHORT STAY AREA OR RECOVERY ROOM!!**  IF YOU WILL BE ADMITTED INTO THE HOSPITAL YOU ARE ALLOWED ONLY FOUR SUPPORT PEOPLE DURING VISITATION HOURS ONLY (7 AM -8PM)   The support person(s) must pass our screening, gel in and out, and wear a mask at all times, including in the patient's room. Patients must also wear a mask when staff or their support person are in the room. Visitors GUEST BADGE MUST BE WORN VISIBLY  One adult visitor may remain with you overnight and MUST be in the room by 8 P.M.     Your procedure is scheduled on: 11/29/22   Report to [facility] Main Entrance    Report to admitting at : 5:15 AM   Call this number if you have problems the morning of surgery [phone]   Do not eat food :After Midnight.   After Midnight you may have the following liquids until: 4:30 AM DAY OF SURGERY  Water Black Coffee (sugar ok, NO MILK/CREAM OR CREAMERS)  Tea (sugar ok, NO MILK/CREAM OR CREAMERS) regular and decaf                             Plain Jell-O (NO RED)                                           Fruit ices (not with fruit pulp, NO RED)                                     Popsicles (NO RED)                                                                  Juice: apple, WHITE grape, WHITE cranberry Sports drinks like Gatorade (NO RED)   The day of surgery:  Drink ONE (1) Pre-Surgery Clear Ensure at : 4:30 AM the morning of surgery. Drink in one sitting. Do not sip.  This drink was given to you during your hospital  pre-op appointment visit. Nothing else to drink after completing the  Pre-Surgery Clear Ensure or G2.          If you have questions, please contact your surgeon's office.  FOLLOW ANY ADDITIONAL PRE OP INSTRUCTIONS YOU RECEIVED FROM YOUR SURGEON'S OFFICE!!!   Oral  Hygiene is also important to reduce your risk of infection.                                    Remember - BRUSH YOUR TEETH THE MORNING OF SURGERY WITH YOUR REGULAR TOOTHPASTE  DENTURES WILL BE REMOVED PRIOR TO SURGERY PLEASE DO NOT APPLY "Poly grip" OR ADHESIVES!!!   Do NOT smoke after Midnight   Take these medicines the morning of surgery with  A SIP OF WATER: alprazolam,loratadine,famotidine,omeprazole.Use inhalers as usual.                              You may not have any metal on your body including hair pins, jewelry, and body piercing             Do not wear lotions, powders, perfumes/cologne, or deodorant              Men may shave face and neck.   Do not bring valuables to the hospital. [facility] IS NOT             RESPONSIBLE   FOR VALUABLES.   Contacts, glasses, or bridgework may not be worn into surgery.   Bring small overnight bag day of surgery.   DO NOT BRING YOUR HOME MEDICATIONS TO THE HOSPITAL. PHARMACY WILL DISPENSE MEDICATIONS LISTED ON YOUR MEDICATION LIST TO YOU DURING YOUR ADMISSION IN THE HOSPITAL!    Patients discharged on the day of surgery will not be allowed to drive home.  Someone NEEDS to stay with you for the first 24 hours after anesthesia.   Special Instructions: Bring a copy of your healthcare power of attorney and living will documents         the day of surgery if you haven't scanned them before.              Please read over the following fact sheets you were given: IF YOU HAVE QUESTIONS ABOUT YOUR PRE-OP INSTRUCTIONS PLEASE CALL [phone]    [facility] - Preparing for Surgery Before surgery, you can play an important role.  Because skin is not sterile, your skin needs to be as free of germs as possible.  You can reduce the number of germs on your skin by washing with CHG (chlorahexidine gluconate) soap before surgery.  CHG is an antiseptic cleaner which kills germs and bonds with the skin to continue killing germs even after washing. Please  DO NOT use if you have an allergy to CHG or antibacterial soaps.  If your skin becomes reddened/irritated stop using the CHG and inform your nurse when you arrive at Short Stay. Do not shave (including legs and underarms) for at least 48 hours prior to the first CHG shower.  You may shave your face/neck. Please follow these instructions carefully:  1.  Shower with CHG Soap the night before surgery and the  morning of Surgery.  2.  If you choose to wash your hair, wash your hair first as usual with your  normal  shampoo.  3.  After you shampoo, rinse your hair and body thoroughly to remove the  shampoo.                           4.  Use CHG as you would any other liquid soap.  You can apply chg directly  to the skin and wash                       Gently with a scrungie or clean washcloth.  5.  Apply the CHG Soap to your body ONLY FROM THE NECK DOWN.   Do not use on face/ open                           Wound or open sores. Avoid contact with  eyes, ears mouth and genitals (private parts).                       Wash face,  Genitals (private parts) with your normal soap.             6.  Wash thoroughly, paying special attention to the area where your surgery  will be performed.  7.  Thoroughly rinse your body with warm water from the neck down.  8.  DO NOT shower/wash with your normal soap after using and rinsing off  the CHG Soap.                9.  Pat yourself dry with a clean towel.            10.  Wear clean pajamas.            11.  Place clean sheets on your bed the night of your first shower and do not  sleep with pets. Day of Surgery : Do not apply any lotions/deodorants the morning of surgery.  Please wear clean clothes to the hospital/surgery center.  FAILURE TO FOLLOW THESE INSTRUCTIONS MAY RESULT IN THE CANCELLATION OF YOUR SURGERY PATIENT SIGNATURE_________________________________  NURSE  SIGNATURE__________________________________  ________________________________________________________________________  Shawn Owens  An incentive spirometer is a tool that can help keep your lungs clear and active. This tool measures how well you are filling your lungs with each breath. Taking long deep breaths may help reverse or decrease the chance of developing breathing (pulmonary) problems (especially infection) following: A long period of time when you are unable to move or be active. BEFORE THE PROCEDURE  If the spirometer includes an indicator to show your best effort, your nurse or respiratory therapist will set it to a desired goal. If possible, sit up straight or lean slightly forward. Try not to slouch. Hold the incentive spirometer in an upright position. INSTRUCTIONS FOR USE  Sit on the edge of your bed if possible, or sit up as far as you can in bed or on a chair. Hold the incentive spirometer in an upright position. Breathe out normally. Place the mouthpiece in your mouth and seal your lips tightly around it. Breathe in slowly and as deeply as possible, raising the piston or the ball toward the top of the column. Hold your breath for 3-5 seconds or for as long as possible. Allow the piston or ball to fall to the bottom of the column. Remove the mouthpiece from your mouth and breathe out normally. Rest for a few seconds and repeat Steps 1 through 7 at least 10 times every 1-2 hours when you are awake. Take your time and take a few normal breaths between deep breaths. The spirometer may include an indicator to show your best effort. Use the indicator as a goal to work toward during each repetition. After each set of 10 deep breaths, practice coughing to be sure your lungs are clear. If you have an incision (the cut made at the time of surgery), support your incision when coughing by placing a pillow or rolled up towels firmly against it. Once you are able to get out of  bed, walk around indoors and cough well. You may stop using the incentive spirometer when instructed by your caregiver.  RISKS AND COMPLICATIONS Take your time so you do not get dizzy or light-headed. If you are in pain, you may need to take or ask for pain medication before doing incentive spirometry.  It is harder to take a deep breath if you are having pain. AFTER USE Rest and breathe slowly and easily. It can be helpful to keep track of a log of your progress. Your caregiver can provide you with a simple table to help with this. If you are using the spirometer at home, follow these instructions: SEEK MEDICAL CARE IF:  You are having difficultly using the spirometer. You have trouble using the spirometer as often as instructed. Your pain medication is not giving enough relief while using the spirometer. You develop fever of 100.5 F (38.1 C) or higher. SEEK IMMEDIATE MEDICAL CARE IF:  You cough up bloody sputum that had not been present before. You develop fever of 102 F (38.9 C) or greater. You develop worsening pain at or near the incision site. MAKE SURE YOU:  Understand these instructions. Will watch your condition. Will get help right away if you are not doing well or get worse. Document Released: 06/24/2006 Document Revised: 05/06/2011 Document Reviewed: 08/25/2006 [facility] Patient Information 2014 [place], [place].   ________________________________________________________________________

## 2022-11-19 ENCOUNTER — Encounter (HOSPITAL_COMMUNITY)
Admission: RE | Admit: 2022-11-19 | Discharge: 2022-11-19 | Disposition: A | Discharge status: Home / Self Care | Payer: Medicaid Other | Source: Ambulatory Visit | Attending: Orthopedic Surgery | Admitting: Orthopedic Surgery

## 2022-11-19 ENCOUNTER — Other Ambulatory Visit: Payer: Self-pay

## 2022-11-19 ENCOUNTER — Encounter (HOSPITAL_COMMUNITY): Payer: Self-pay

## 2022-11-19 VITALS — BP 149/93 | HR 98 | Temp 97.7°F | Ht 70.0 in | Wt 234.0 lb

## 2022-11-19 DIAGNOSIS — Z01818 Encounter for other preprocedural examination: Secondary | ICD-10-CM | POA: Diagnosis present

## 2022-11-19 DIAGNOSIS — Z01810 Encounter for preprocedural cardiovascular examination: Secondary | ICD-10-CM | POA: Insufficient documentation

## 2022-11-19 DIAGNOSIS — I10 Essential (primary) hypertension: Secondary | ICD-10-CM | POA: Insufficient documentation

## 2022-11-19 DIAGNOSIS — Z01812 Encounter for preprocedural laboratory examination: Secondary | ICD-10-CM | POA: Insufficient documentation

## 2022-11-19 HISTORY — DX: Dyspnea, unspecified: R06.00

## 2022-11-19 LAB — BASIC METABOLIC PANEL
Anion gap: 7 (ref 5–15)
BUN: 18 mg/dL (ref 6–20)
CO2: 26 mmol/L (ref 22–32)
Calcium: 8.8 mg/dL — ABNORMAL LOW (ref 8.9–10.3)
Chloride: 106 mmol/L (ref 98–111)
Creatinine, Ser: 1.05 mg/dL (ref 0.61–1.24)
GFR, Estimated: >60 mL/min (ref >60)
Glucose, Bld: 126 mg/dL — ABNORMAL HIGH (ref 70–99)
Potassium: 4.4 mmol/L (ref 3.5–5.1)
Sodium: 139 mmol/L (ref 135–145)

## 2022-11-19 LAB — CBC
HCT: 47.7 % (ref 39.0–52.0)
Hemoglobin: 14.9 g/dL (ref 13.0–17.0)
MCH: 27.9 pg (ref 26.0–34.0)
MCHC: 31.2 g/dL (ref 30.0–36.0)
MCV: 89.2 fL (ref 80.0–100.0)
Platelets: 186 10*3/uL (ref 150–400)
RBC: 5.35 MIL/uL (ref 4.22–5.81)
RDW: 13.9 % (ref 11.5–15.5)
WBC: 6.2 10*3/uL (ref 4.0–10.5)
nRBC: 0 % (ref 0.0–0.2)

## 2022-11-19 NOTE — Progress Notes (Signed)
For Short Stay: COVID SWAB appointment date:  Bowel Prep reminder:   For Anesthesia: PCP - Lonie Peak, PA-C  Cardiologist - N/A  Chest x-ray -  EKG - 11/19/22 Stress Test -  ECHO -  Cardiac Cath -  Pacemaker/ICD device last checked: Pacemaker orders received: Device Rep notified:  Spinal Cord Stimulator:N/A  Sleep Study - Yes CPAP - NO  Fasting Blood Sugar - N/A Checks Blood Sugar _____ times a day Date and result of last Hgb A1c-  Last dose of GLP1 agonist- N/A GLP1 instructions:   Last dose of SGLT-2 inhibitors- N/A SGLT-2 instructions:   Blood Thinner Instructions: N/A Aspirin Instructions: Last Dose:  Activity level: Can go up a flight of stairs and activities of daily living without stopping and without chest pain and/or shortness of breath   Able to exercise without chest pain and/or shortness of breath   Anesthesia review: Hx: HTN,OSA(NO CPAP).  Patient denies shortness of breath, fever, cough and chest pain at PAT appointment   Patient verbalized understanding of instructions that were given to them at the PAT appointment. Patient was also instructed that they will need to review over the PAT instructions again at home before surgery.

## 2022-11-29 ENCOUNTER — Other Ambulatory Visit: Payer: Self-pay

## 2022-11-29 ENCOUNTER — Encounter (HOSPITAL_COMMUNITY)
Admission: RE | Disposition: A | Discharge status: Home / Self Care | Payer: Self-pay | Source: Ambulatory Visit | Attending: Orthopedic Surgery

## 2022-11-29 ENCOUNTER — Encounter (HOSPITAL_COMMUNITY): Payer: Self-pay | Admitting: Orthopedic Surgery

## 2022-11-29 ENCOUNTER — Ambulatory Visit (HOSPITAL_COMMUNITY): Payer: Medicaid Other

## 2022-11-29 ENCOUNTER — Inpatient Hospital Stay (HOSPITAL_COMMUNITY)
Admission: RE | Admit: 2022-11-29 | Discharge: 2022-12-02 | DRG: 502 | Disposition: A | Discharge status: Home / Self Care | Payer: Medicaid Other | Source: Ambulatory Visit | Attending: Orthopedic Surgery | Admitting: Orthopedic Surgery

## 2022-11-29 DIAGNOSIS — M75101 Unspecified rotator cuff tear or rupture of right shoulder, not specified as traumatic: Secondary | ICD-10-CM | POA: Diagnosis not present

## 2022-11-29 DIAGNOSIS — E1165 Type 2 diabetes mellitus with hyperglycemia: Secondary | ICD-10-CM | POA: Diagnosis present

## 2022-11-29 DIAGNOSIS — F419 Anxiety disorder, unspecified: Secondary | ICD-10-CM | POA: Diagnosis present

## 2022-11-29 DIAGNOSIS — M170 Bilateral primary osteoarthritis of knee: Secondary | ICD-10-CM | POA: Diagnosis present

## 2022-11-29 DIAGNOSIS — X58XXXA Exposure to other specified factors, initial encounter: Secondary | ICD-10-CM | POA: Diagnosis present

## 2022-11-29 DIAGNOSIS — Z79899 Other long term (current) drug therapy: Secondary | ICD-10-CM

## 2022-11-29 DIAGNOSIS — S43431A Superior glenoid labrum lesion of right shoulder, initial encounter: Secondary | ICD-10-CM | POA: Diagnosis present

## 2022-11-29 DIAGNOSIS — Z9889 Other specified postprocedural states: Principal | ICD-10-CM

## 2022-11-29 DIAGNOSIS — I10 Essential (primary) hypertension: Secondary | ICD-10-CM | POA: Diagnosis present

## 2022-11-29 DIAGNOSIS — M160 Bilateral primary osteoarthritis of hip: Secondary | ICD-10-CM | POA: Diagnosis present

## 2022-11-29 DIAGNOSIS — M67911 Unspecified disorder of synovium and tendon, right shoulder: Secondary | ICD-10-CM | POA: Diagnosis present

## 2022-11-29 DIAGNOSIS — Z7951 Long term (current) use of inhaled steroids: Secondary | ICD-10-CM

## 2022-11-29 DIAGNOSIS — K219 Gastro-esophageal reflux disease without esophagitis: Secondary | ICD-10-CM | POA: Diagnosis present

## 2022-11-29 HISTORY — PX: SHOULDER OPEN ROTATOR CUFF REPAIR: SHX2407

## 2022-11-29 HISTORY — PX: SHOULDER ARTHROSCOPY WITH SUBACROMIAL DECOMPRESSION: SHX5684

## 2022-11-29 LAB — GLUCOSE, CAPILLARY: Glucose-Capillary: 176 mg/dL — ABNORMAL HIGH (ref 70–99)

## 2022-11-29 SURGERY — SHOULDER ARTHROSCOPY WITH SUBACROMIAL DECOMPRESSION
Anesthesia: General | Site: Shoulder | Laterality: Right

## 2022-11-29 MED ORDER — LIDOCAINE HCL (PF) 2 % IJ SOLN
INTRAMUSCULAR | Status: AC
  Filled 2022-11-29: qty 5

## 2022-11-29 MED ORDER — HYDROMORPHONE HCL 1 MG/ML IJ SOLN
0.5000 mg | INTRAMUSCULAR | Status: DC | PRN
  Administered 2022-11-30 (×2): 0.5 mg via INTRAVENOUS
  Filled 2022-11-29 (×3): qty 1

## 2022-11-29 MED ORDER — MOMETASONE FURO-FORMOTEROL FUM 200-5 MCG/ACT IN AERO
2.0000 | INHALATION_SPRAY | Freq: Two times a day (BID) | RESPIRATORY_TRACT | Status: DC
  Administered 2022-11-29 – 2022-12-02 (×6): 2 via RESPIRATORY_TRACT
  Filled 2022-11-29: qty 8.8

## 2022-11-29 MED ORDER — 0.9 % SODIUM CHLORIDE (POUR BTL) OPTIME
TOPICAL | Status: DC | PRN
Start: 2022-11-29 — End: 2022-11-29
  Administered 2022-11-29: 1000 mL

## 2022-11-29 MED ORDER — MIDAZOLAM HCL 5 MG/5ML IJ SOLN
INTRAMUSCULAR | Status: DC | PRN
  Administered 2022-11-29: 2 mg via INTRAVENOUS

## 2022-11-29 MED ORDER — ALPRAZOLAM 0.5 MG PO TABS
0.5000 mg | ORAL_TABLET | Freq: Every day | ORAL | Status: DC
  Administered 2022-11-30 – 2022-12-01 (×2): 0.5 mg via ORAL
  Filled 2022-11-29 (×2): qty 1

## 2022-11-29 MED ORDER — KETOROLAC TROMETHAMINE 30 MG/ML IJ SOLN: 30.0000 mg | Freq: Three times a day (TID) | INTRAMUSCULAR | Status: DC

## 2022-11-29 MED ORDER — ALBUTEROL SULFATE (2.5 MG/3ML) 0.083% IN NEBU
2.5000 mg | INHALATION_SOLUTION | Freq: Once | RESPIRATORY_TRACT | Status: AC
  Administered 2022-11-29: 2.5 mg via RESPIRATORY_TRACT
  Filled 2022-11-29: qty 3

## 2022-11-29 MED ORDER — MIDAZOLAM HCL 2 MG/2ML IJ SOLN
INTRAMUSCULAR | Status: AC
  Filled 2022-11-29: qty 2

## 2022-11-29 MED ORDER — ONDANSETRON HCL 4 MG/2ML IJ SOLN: 4.0000 mg | Freq: Once | INTRAMUSCULAR | Status: DC | PRN

## 2022-11-29 MED ORDER — BUPIVACAINE-EPINEPHRINE 0.25% -1:200000 IJ SOLN
INTRAMUSCULAR | Status: AC
  Filled 2022-11-29: qty 1

## 2022-11-29 MED ORDER — BUPIVACAINE-EPINEPHRINE 0.25% -1:200000 IJ SOLN
INTRAMUSCULAR | Status: DC | PRN
  Administered 2022-11-29: 30 mL

## 2022-11-29 MED ORDER — METOCLOPRAMIDE HCL 5 MG PO TABS
5.0000 mg | ORAL_TABLET | Freq: Three times a day (TID) | ORAL | Status: DC | PRN
  Administered 2022-12-01: 10 mg via ORAL
  Filled 2022-11-29: qty 2

## 2022-11-29 MED ORDER — OXYCODONE HCL 5 MG PO TABS
10.0000 mg | ORAL_TABLET | ORAL | Status: DC | PRN
  Administered 2022-11-30 – 2022-12-01 (×3): 15 mg via ORAL
  Administered 2022-12-01: 10 mg via ORAL
  Administered 2022-12-01 (×2): 15 mg via ORAL
  Filled 2022-11-29 (×2): qty 3
  Filled 2022-11-29: qty 2
  Filled 2022-11-29 (×2): qty 3
  Filled 2022-11-29: qty 2
  Filled 2022-11-29 (×2): qty 3

## 2022-11-29 MED ORDER — MELOXICAM 15 MG PO TABS
15.0000 mg | ORAL_TABLET | Freq: Every day | ORAL | Status: DC
  Administered 2022-11-30 – 2022-12-01 (×2): 15 mg via ORAL
  Filled 2022-11-29 (×2): qty 1

## 2022-11-29 MED ORDER — OXYCODONE HCL 5 MG PO TABS
5.0000 mg | ORAL_TABLET | Freq: Once | ORAL | Status: AC | PRN
  Administered 2022-11-29: 5 mg via ORAL

## 2022-11-29 MED ORDER — FENTANYL CITRATE PF 50 MCG/ML IJ SOSY
25.0000 ug | PREFILLED_SYRINGE | INTRAMUSCULAR | Status: DC | PRN
  Administered 2022-11-29 (×2): 50 ug via INTRAVENOUS

## 2022-11-29 MED ORDER — TRIAMCINOLONE ACETONIDE 0.1 % EX LOTN
1.0000 | TOPICAL_LOTION | Freq: Two times a day (BID) | CUTANEOUS | Status: DC
  Filled 2022-11-29: qty 60

## 2022-11-29 MED ORDER — SUGAMMADEX SODIUM 200 MG/2ML IV SOLN
INTRAVENOUS | Status: DC | PRN
  Administered 2022-11-29: 200 mg via INTRAVENOUS

## 2022-11-29 MED ORDER — DEXMEDETOMIDINE HCL IN NACL 80 MCG/20ML IV SOLN
INTRAVENOUS | Status: DC | PRN
  Administered 2022-11-29: 20 ug via INTRAVENOUS

## 2022-11-29 MED ORDER — CHLORHEXIDINE GLUCONATE 0.12 % MT SOLN
15.0000 mL | Freq: Once | OROMUCOSAL | Status: AC
  Administered 2022-11-29: 15 mL via OROMUCOSAL

## 2022-11-29 MED ORDER — BUPIVACAINE HCL (PF) 0.5 % IJ SOLN
INTRAMUSCULAR | Status: DC | PRN
Start: 2022-11-29 — End: 2022-11-29
  Administered 2022-11-29: 10 mL

## 2022-11-29 MED ORDER — OXYCODONE HCL 5 MG PO TABS
ORAL_TABLET | ORAL | Status: AC
  Filled 2022-11-29: qty 1

## 2022-11-29 MED ORDER — ALPRAZOLAM 0.5 MG PO TABS: 0.5000 mg | ORAL_TABLET | Freq: Every day | ORAL | Status: DC

## 2022-11-29 MED ORDER — DOCUSATE SODIUM 100 MG PO CAPS
100.0000 mg | ORAL_CAPSULE | Freq: Two times a day (BID) | ORAL | Status: DC
  Administered 2022-11-29 – 2022-12-02 (×6): 100 mg via ORAL
  Filled 2022-11-29 (×6): qty 1

## 2022-11-29 MED ORDER — GABAPENTIN 300 MG PO CAPS
600.0000 mg | ORAL_CAPSULE | Freq: Three times a day (TID) | ORAL | Status: DC
  Administered 2022-11-29 – 2022-12-02 (×9): 600 mg via ORAL
  Filled 2022-11-29 (×9): qty 2

## 2022-11-29 MED ORDER — OXYCODONE-ACETAMINOPHEN 5-325 MG PO TABS
1.0000 | ORAL_TABLET | ORAL | 0 refills | Status: DC | PRN
Start: 2022-11-29 — End: 2023-06-24

## 2022-11-29 MED ORDER — ROCURONIUM BROMIDE 10 MG/ML (PF) SYRINGE
PREFILLED_SYRINGE | INTRAVENOUS | Status: AC
  Filled 2022-11-29: qty 10

## 2022-11-29 MED ORDER — BUDESONIDE 0.5 MG/2ML IN SUSP
0.5000 mg | Freq: Two times a day (BID) | RESPIRATORY_TRACT | Status: DC
  Administered 2022-11-30 – 2022-12-02 (×4): 0.5 mg via RESPIRATORY_TRACT
  Filled 2022-11-29 (×5): qty 2

## 2022-11-29 MED ORDER — PROPOFOL 10 MG/ML IV BOLUS
INTRAVENOUS | Status: DC | PRN
  Administered 2022-11-29: 160 mg via INTRAVENOUS

## 2022-11-29 MED ORDER — DEXAMETHASONE SODIUM PHOSPHATE 10 MG/ML IJ SOLN
INTRAMUSCULAR | Status: DC | PRN
  Administered 2022-11-29: 10 mg via INTRAVENOUS

## 2022-11-29 MED ORDER — ONDANSETRON HCL 4 MG/2ML IJ SOLN: 4.0000 mg | Freq: Four times a day (QID) | INTRAMUSCULAR | Status: DC | PRN

## 2022-11-29 MED ORDER — IBUPROFEN 400 MG PO TABS
600.0000 mg | ORAL_TABLET | Freq: Every day | ORAL | Status: DC
  Administered 2022-11-30 – 2022-12-01 (×2): 600 mg via ORAL
  Filled 2022-11-29 (×2): qty 1

## 2022-11-29 MED ORDER — ALBUTEROL SULFATE (2.5 MG/3ML) 0.083% IN NEBU: 2.5000 mg | INHALATION_SOLUTION | RESPIRATORY_TRACT | Status: DC | PRN

## 2022-11-29 MED ORDER — PANTOPRAZOLE SODIUM 40 MG PO TBEC
40.0000 mg | DELAYED_RELEASE_TABLET | Freq: Every day | ORAL | Status: DC
  Administered 2022-11-30 – 2022-12-02 (×3): 40 mg via ORAL
  Filled 2022-11-29 (×3): qty 1

## 2022-11-29 MED ORDER — FENTANYL CITRATE (PF) 100 MCG/2ML IJ SOLN
INTRAMUSCULAR | Status: DC | PRN
  Administered 2022-11-29: 100 ug via INTRAVENOUS

## 2022-11-29 MED ORDER — ALBUTEROL SULFATE (2.5 MG/3ML) 0.083% IN NEBU
INHALATION_SOLUTION | RESPIRATORY_TRACT | Status: AC
  Filled 2022-11-29: qty 3

## 2022-11-29 MED ORDER — FENTANYL CITRATE PF 50 MCG/ML IJ SOSY
PREFILLED_SYRINGE | INTRAMUSCULAR | Status: AC
  Administered 2022-11-29: 50 ug via INTRAVENOUS
  Filled 2022-11-29: qty 2

## 2022-11-29 MED ORDER — KETOROLAC TROMETHAMINE 30 MG/ML IJ SOLN
15.0000 mg | Freq: Once | INTRAMUSCULAR | Status: AC
  Administered 2022-11-29: 15 mg via INTRAVENOUS

## 2022-11-29 MED ORDER — ALBUTEROL SULFATE (2.5 MG/3ML) 0.083% IN NEBU
2.5000 mg | INHALATION_SOLUTION | Freq: Four times a day (QID) | RESPIRATORY_TRACT | Status: DC | PRN
  Administered 2022-11-29: 2.5 mg via RESPIRATORY_TRACT

## 2022-11-29 MED ORDER — ROCURONIUM BROMIDE 100 MG/10ML IV SOLN
INTRAVENOUS | Status: DC | PRN
  Administered 2022-11-29: 70 mg via INTRAVENOUS

## 2022-11-29 MED ORDER — PHENYLEPHRINE HCL-NACL 20-0.9 MG/250ML-% IV SOLN
INTRAVENOUS | Status: DC | PRN
  Administered 2022-11-29: 25 ug/min via INTRAVENOUS

## 2022-11-29 MED ORDER — CEFAZOLIN SODIUM-DEXTROSE 2-4 GM/100ML-% IV SOLN
2.0000 g | INTRAVENOUS | Status: AC
  Administered 2022-11-29: 2 g via INTRAVENOUS
  Filled 2022-11-29: qty 100

## 2022-11-29 MED ORDER — SODIUM CHLORIDE 0.9 % IV SOLN: INTRAVENOUS | Status: DC

## 2022-11-29 MED ORDER — ALBUTEROL SULFATE HFA 108 (90 BASE) MCG/ACT IN AERS: 2.0000 | INHALATION_SPRAY | Freq: Four times a day (QID) | RESPIRATORY_TRACT | Status: DC | PRN

## 2022-11-29 MED ORDER — LACTATED RINGERS IV SOLN: INTRAVENOUS | Status: DC

## 2022-11-29 MED ORDER — CEFAZOLIN SODIUM-DEXTROSE 2-4 GM/100ML-% IV SOLN
2.0000 g | Freq: Three times a day (TID) | INTRAVENOUS | Status: AC
  Administered 2022-11-29 – 2022-11-30 (×2): 2 g via INTRAVENOUS
  Filled 2022-11-29 (×2): qty 100

## 2022-11-29 MED ORDER — MENTHOL 3 MG MT LOZG: 1.0000 | LOZENGE | OROMUCOSAL | Status: DC | PRN

## 2022-11-29 MED ORDER — KETOROLAC TROMETHAMINE 30 MG/ML IJ SOLN
INTRAMUSCULAR | Status: AC
  Filled 2022-11-29: qty 1

## 2022-11-29 MED ORDER — METHOCARBAMOL 500 MG PO TABS
500.0000 mg | ORAL_TABLET | Freq: Four times a day (QID) | ORAL | Status: DC | PRN
  Administered 2022-11-29 – 2022-12-02 (×7): 500 mg via ORAL
  Filled 2022-11-29 (×7): qty 1

## 2022-11-29 MED ORDER — ACETAMINOPHEN 325 MG PO TABS
325.0000 mg | ORAL_TABLET | Freq: Four times a day (QID) | ORAL | Status: DC | PRN
  Administered 2022-12-01: 650 mg via ORAL
  Filled 2022-11-29: qty 2

## 2022-11-29 MED ORDER — DULOXETINE HCL 60 MG PO CPEP
60.0000 mg | ORAL_CAPSULE | Freq: Every day | ORAL | Status: DC
  Administered 2022-11-30 – 2022-12-02 (×3): 60 mg via ORAL
  Filled 2022-11-29 (×3): qty 1

## 2022-11-29 MED ORDER — DICLOFENAC SODIUM 75 MG PO TBEC
75.0000 mg | DELAYED_RELEASE_TABLET | Freq: Two times a day (BID) | ORAL | Status: DC
  Administered 2022-11-29 – 2022-12-02 (×6): 75 mg via ORAL
  Filled 2022-11-29 (×7): qty 1

## 2022-11-29 MED ORDER — AMISULPRIDE (ANTIEMETIC) 5 MG/2ML IV SOLN
10.0000 mg | Freq: Once | INTRAVENOUS | Status: AC
  Administered 2022-11-29: 10 mg via INTRAVENOUS

## 2022-11-29 MED ORDER — PROPOFOL 10 MG/ML IV BOLUS
INTRAVENOUS | Status: AC
  Filled 2022-11-29: qty 20

## 2022-11-29 MED ORDER — ONDANSETRON HCL 4 MG PO TABS
4.0000 mg | ORAL_TABLET | Freq: Four times a day (QID) | ORAL | Status: DC | PRN
  Filled 2022-11-29: qty 1

## 2022-11-29 MED ORDER — METHOCARBAMOL 500 MG IVPB - SIMPLE MED
INTRAVENOUS | Status: AC
  Filled 2022-11-29: qty 55

## 2022-11-29 MED ORDER — BUPIVACAINE LIPOSOME 1.3 % IJ SUSP
INTRAMUSCULAR | Status: DC | PRN
  Administered 2022-11-29: 10 mL

## 2022-11-29 MED ORDER — SODIUM CHLORIDE 0.9 % IR SOLN
Status: DC | PRN
Start: 2022-11-29 — End: 2022-11-29
  Administered 2022-11-29: 6000 mL

## 2022-11-29 MED ORDER — ONDANSETRON HCL 4 MG/2ML IJ SOLN
INTRAMUSCULAR | Status: DC | PRN
  Administered 2022-11-29: 4 mg via INTRAVENOUS

## 2022-11-29 MED ORDER — METHOCARBAMOL 500 MG IVPB - SIMPLE MED
500.0000 mg | Freq: Four times a day (QID) | INTRAVENOUS | Status: DC | PRN
  Administered 2022-11-29: 500 mg via INTRAVENOUS

## 2022-11-29 MED ORDER — LOSARTAN POTASSIUM 50 MG PO TABS
50.0000 mg | ORAL_TABLET | Freq: Every day | ORAL | Status: DC
  Administered 2022-11-30 – 2022-12-02 (×3): 50 mg via ORAL
  Filled 2022-11-29 (×3): qty 1

## 2022-11-29 MED ORDER — DEXAMETHASONE SODIUM PHOSPHATE 10 MG/ML IJ SOLN
INTRAMUSCULAR | Status: AC
  Filled 2022-11-29: qty 1

## 2022-11-29 MED ORDER — ACETAMINOPHEN 10 MG/ML IV SOLN: 1000.0000 mg | Freq: Once | INTRAVENOUS | Status: DC | PRN

## 2022-11-29 MED ORDER — OXYCODONE HCL 5 MG PO TABS
5.0000 mg | ORAL_TABLET | ORAL | Status: DC | PRN
  Administered 2022-11-29 – 2022-12-02 (×5): 10 mg via ORAL
  Administered 2022-12-02: 5 mg via ORAL
  Filled 2022-11-29: qty 2
  Filled 2022-11-29: qty 1
  Filled 2022-11-29 (×2): qty 2

## 2022-11-29 MED ORDER — ONDANSETRON HCL 4 MG/2ML IJ SOLN
INTRAMUSCULAR | Status: AC
  Filled 2022-11-29: qty 2

## 2022-11-29 MED ORDER — METHOCARBAMOL 500 MG PO TABS: 500.0000 mg | ORAL_TABLET | Freq: Three times a day (TID) | ORAL | 1 refills | Status: DC | PRN

## 2022-11-29 MED ORDER — FAMOTIDINE 20 MG PO TABS
20.0000 mg | ORAL_TABLET | Freq: Every day | ORAL | Status: DC
  Administered 2022-11-30 – 2022-12-02 (×3): 20 mg via ORAL
  Filled 2022-11-29 (×3): qty 1

## 2022-11-29 MED ORDER — FENTANYL CITRATE PF 50 MCG/ML IJ SOSY
PREFILLED_SYRINGE | INTRAMUSCULAR | Status: AC
  Filled 2022-11-29: qty 1

## 2022-11-29 MED ORDER — ALBUTEROL SULFATE (2.5 MG/3ML) 0.083% IN NEBU
2.5000 mg | INHALATION_SOLUTION | Freq: Four times a day (QID) | RESPIRATORY_TRACT | Status: DC | PRN
  Filled 2022-11-29: qty 3

## 2022-11-29 MED ORDER — ORAL CARE MOUTH RINSE: 15.0000 mL | Freq: Once | OROMUCOSAL | Status: AC

## 2022-11-29 MED ORDER — ACYCLOVIR 400 MG PO TABS
400.0000 mg | ORAL_TABLET | Freq: Every day | ORAL | Status: DC
  Administered 2022-11-30 – 2022-12-02 (×3): 400 mg via ORAL
  Filled 2022-11-29 (×3): qty 1

## 2022-11-29 MED ORDER — ACETAMINOPHEN 325 MG PO TABS: 325.0000 mg | ORAL_TABLET | ORAL | Status: DC | PRN

## 2022-11-29 MED ORDER — METOCLOPRAMIDE HCL 5 MG/ML IJ SOLN: 5.0000 mg | Freq: Three times a day (TID) | INTRAMUSCULAR | Status: DC | PRN

## 2022-11-29 MED ORDER — FENTANYL CITRATE (PF) 100 MCG/2ML IJ SOLN
INTRAMUSCULAR | Status: AC
  Filled 2022-11-29: qty 2

## 2022-11-29 MED ORDER — AMISULPRIDE (ANTIEMETIC) 5 MG/2ML IV SOLN
INTRAVENOUS | Status: AC
  Filled 2022-11-29: qty 4

## 2022-11-29 MED ORDER — PHENOL 1.4 % MT LIQD: 1.0000 | OROMUCOSAL | Status: DC | PRN

## 2022-11-29 MED ORDER — ONDANSETRON HCL 4 MG PO TABS: 4.0000 mg | ORAL_TABLET | Freq: Three times a day (TID) | ORAL | 1 refills | Status: AC | PRN

## 2022-11-29 MED ORDER — OXYCODONE HCL 5 MG/5ML PO SOLN: 5.0000 mg | Freq: Once | ORAL | Status: AC | PRN

## 2022-11-29 MED ORDER — OXYCODONE HCL 5 MG PO TABS
ORAL_TABLET | ORAL | Status: AC
  Administered 2022-11-29: 5 mg via ORAL
  Filled 2022-11-29: qty 1

## 2022-11-29 MED ORDER — KETOROLAC TROMETHAMINE 30 MG/ML IJ SOLN
30.0000 mg | Freq: Once | INTRAMUSCULAR | Status: AC
  Administered 2022-11-29: 30 mg via INTRAVENOUS
  Filled 2022-11-29: qty 1

## 2022-11-29 SURGICAL SUPPLY — 88 items
AID PSTN UNV HD RSTRNT DISP (MISCELLANEOUS) ×1
ANCH FIBERTAK DL SP W/NDL 2.6 (Anchor) ×2 IMPLANT
ANCH SUT 2 FBRTK BLU WHT (Anchor) ×1 IMPLANT
ANCH SUT FBRTK 2 LD NDL (Anchor) ×2 IMPLANT
ANCHOR FIBRTK DL SP W/NDL 2.6 (Anchor) IMPLANT
ANCHOR SUT FBRTK 2 WIRE (Anchor) IMPLANT
APL SKNCLS STERI-STRIP NONHPOA (GAUZE/BANDAGES/DRESSINGS) ×1
BAG COUNTER SPONGE SURGICOUNT (BAG) IMPLANT
BAG SPEC THK2 15X12 ZIP CLS (MISCELLANEOUS) ×1
BAG SPNG CNTER NS LX DISP (BAG)
BAG ZIPLOCK 12X15 (MISCELLANEOUS) ×1 IMPLANT
BENZOIN TINCTURE PRP APPL 2/3 (GAUZE/BANDAGES/DRESSINGS) ×1 IMPLANT
BLADE AVERAGE 25X9 (BLADE) IMPLANT
BLADE CLIPPER SURG (BLADE) IMPLANT
BLADE EXCALIBUR 4.0X13 (MISCELLANEOUS) IMPLANT
BLADE SURG 15 STRL LF DISP TIS (BLADE) ×1 IMPLANT
BLADE SURG 15 STRL SS (BLADE) ×1
BLADE SURG SZ10 CARB STEEL (BLADE) ×1 IMPLANT
BURR OVAL 8 FLU 4.0X13 (MISCELLANEOUS) ×1 IMPLANT
CLEANER TIP ELECTROSURG 2X2 (MISCELLANEOUS) ×1 IMPLANT
CLSR STERI-STRIP ANTIMIC 1/2X4 (GAUZE/BANDAGES/DRESSINGS) IMPLANT
COVER SURGICAL LIGHT HANDLE (MISCELLANEOUS) ×1 IMPLANT
CUTTER BONE 4.0MM X 13CM (MISCELLANEOUS) ×1 IMPLANT
DISSECTOR 3.8MM X 13CM (MISCELLANEOUS) IMPLANT
DRAPE IMP U-DRAPE 54X76 (DRAPES) ×1 IMPLANT
DRAPE INCISE IOBAN 66X45 STRL (DRAPES) ×1 IMPLANT
DRAPE ORTHO SPLIT 77X108 STRL (DRAPES) ×2
DRAPE SHEET LG 3/4 BI-LAMINATE (DRAPES) ×1 IMPLANT
DRAPE STERI 35X30 U-POUCH (DRAPES) ×1 IMPLANT
DRAPE SURG 17X11 SM STRL (DRAPES) ×1 IMPLANT
DRAPE SURG ORHT 6 SPLT 77X108 (DRAPES) ×2 IMPLANT
DRAPE U-SHAPE 47X51 STRL (DRAPES) ×1 IMPLANT
DRSG EMULSION OIL 3X3 NADH (GAUZE/BANDAGES/DRESSINGS) ×1 IMPLANT
DURAPREP 26ML APPLICATOR (WOUND CARE) ×1 IMPLANT
ELECT NDL TIP 2.8 STRL (NEEDLE) ×1 IMPLANT
ELECT NEEDLE TIP 2.8 STRL (NEEDLE) ×1
ELECT REM PT RETURN 15FT ADLT (MISCELLANEOUS) ×1 IMPLANT
GAUZE PAD ABD 8X10 STRL (GAUZE/BANDAGES/DRESSINGS) IMPLANT
GAUZE SPONGE 4X4 12PLY STRL (GAUZE/BANDAGES/DRESSINGS) ×1 IMPLANT
GLOVE BIOGEL PI IND STRL 7.5 (GLOVE) ×2 IMPLANT
GLOVE BIOGEL PI IND STRL 8.5 (GLOVE) ×2 IMPLANT
GLOVE ORTHO TXT STRL SZ7.5 (GLOVE) ×1 IMPLANT
GLOVE SURG ORTHO 8.5 STRL (GLOVE) ×1 IMPLANT
GOWN STRL REUS W/ TWL LRG LVL3 (GOWN DISPOSABLE) ×3 IMPLANT
GOWN STRL REUS W/TWL LRG LVL3 (GOWN DISPOSABLE) ×3
KIT BASIN OR (CUSTOM PROCEDURE TRAY) ×1 IMPLANT
KIT KNEE FIBERTAK DISP (KITS) IMPLANT
KIT TURNOVER KIT A (KITS) IMPLANT
MANIFOLD NEPTUNE II (INSTRUMENTS) ×1 IMPLANT
NDL MAYO 6 CRC TAPER PT (NEEDLE) IMPLANT
NDL MAYO CATGUT SZ4 TPR NDL (NEEDLE) IMPLANT
NDL SUT 6 .5 CRC .975X.05 MAYO (NEEDLE) IMPLANT
NEEDLE MAYO 6 CRC TAPER PT (NEEDLE)
NEEDLE MAYO CATGUT SZ4 (NEEDLE)
NEEDLE MAYO TAPER (NEEDLE) ×1
NS IRRIG 1000ML POUR BTL (IV SOLUTION) ×1 IMPLANT
PACK ARTHROSCOPY WL (CUSTOM PROCEDURE TRAY) ×1 IMPLANT
PACK SHOULDER (CUSTOM PROCEDURE TRAY) ×1 IMPLANT
PASSER SUT SWANSON 36MM LOOP (INSTRUMENTS) IMPLANT
PENCIL SMOKE EVACUATOR (MISCELLANEOUS) IMPLANT
PROTECTOR NERVE ULNAR (MISCELLANEOUS) ×1 IMPLANT
RESTRAINT HEAD UNIVERSAL NS (MISCELLANEOUS) ×1 IMPLANT
SLING ARM IMMOBILIZER LRG (SOFTGOODS) IMPLANT
SLING ARM IMMOBILIZER MED (SOFTGOODS) IMPLANT
SLING ULTRA II L (ORTHOPEDIC SUPPLIES) IMPLANT
SPIKE FLUID TRANSFER (MISCELLANEOUS) ×1 IMPLANT
SPONGE T-LAP 4X18 ~~LOC~~+RFID (SPONGE) IMPLANT
STAPLER VISISTAT 35W (STAPLE) IMPLANT
STRIP CLOSURE SKIN 1/2X4 (GAUZE/BANDAGES/DRESSINGS) ×1 IMPLANT
SUT BONE WAX W31G (SUTURE) ×1 IMPLANT
SUT ETHIBOND 0 MO6 C/R (SUTURE) ×2 IMPLANT
SUT FIBERWIRE #2 38 T-5 BLUE (SUTURE) ×3
SUT MNCRL AB 4-0 PS2 18 (SUTURE) ×1 IMPLANT
SUT VIC AB 0 CT1 18XCR BRD 8 (SUTURE) IMPLANT
SUT VIC AB 0 CT1 27 (SUTURE)
SUT VIC AB 0 CT1 27XBRD ANBCTR (SUTURE) IMPLANT
SUT VIC AB 0 CT1 8-18 (SUTURE)
SUT VIC AB 0 CT2 27 (SUTURE) ×2 IMPLANT
SUT VIC AB 2-0 CT1 27 (SUTURE) ×2
SUT VIC AB 2-0 CT1 TAPERPNT 27 (SUTURE) ×2 IMPLANT
SUTURE FIBERWR #2 38 T-5 BLUE (SUTURE) IMPLANT
SYR BULB IRRIG 60ML STRL (SYRINGE) ×1 IMPLANT
TOWEL OR 17X26 10 PK STRL BLUE (TOWEL DISPOSABLE) ×1 IMPLANT
TOWEL OR NON WOVEN STRL DISP B (DISPOSABLE) ×1 IMPLANT
TUBING ARTHROSCOPY IRRIG 16FT (MISCELLANEOUS) ×1 IMPLANT
TUBING CONNECTING 10 (TUBING) ×1 IMPLANT
WAND ABLATOR APOLLO I90 (BUR) ×1 IMPLANT
YANKAUER SUCT BULB TIP NO VENT (SUCTIONS) IMPLANT

## 2022-11-29 NOTE — Anesthesia Procedure Notes (Signed)
Anesthesia Regional Block: Interscalene brachial plexus block   Pre-Anesthetic Checklist: , timeout performed,  Correct Patient, Correct Site, Correct Laterality,  Correct Procedure, Correct Position, site marked,  Risks and benefits discussed,  Pre-op evaluation,  At surgeon's request and post-op pain management  Laterality: Right  Prep: Maximum Sterile Barrier Precautions used, chloraprep       Needles:  Injection technique: Single-shot  Needle Type: Echogenic Stimulator Needle     Needle Length: 5cm  Needle Gauge: 22     Additional Needles:   Procedures:, nerve stimulator,,, ultrasound used (permanent image in chart),,    Narrative:  Start time: 11/29/2022 7:00 AM End time: 11/29/2022 7:05 AM Injection made incrementally with aspirations every 5 mL.  Performed by: Personally  Anesthesiologist: Mariann Barter, MD  Additional Notes: 2% Lidocaine skin wheel.

## 2022-11-29 NOTE — Op Note (Signed)
NAMEHALSEY, CRUMEDY MEDICAL RECORD NO: [id number] ACCOUNT NO: [id number] DATE OF BIRTH: Jul 17, 1965 FACILITY: [facility] LOCATION: [facility] PHYSICIAN: Almedia Balls. Ranell Patrick, MD  Operative Report   DATE OF PROCEDURE: 11/29/2022  PREOPERATIVE DIAGNOSES:  Right shoulder rotator cuff tear.  POSTOPERATIVE DIAGNOSES: 1.  Right shoulder rotator cuff tear. 2.  Right shoulder SLAP tear.  PROCEDURE PERFORMED:  Right shoulder arthroscopy with arthroscopic subacromial decompression with mini open rotator cuff repair and biceps tenodesis in the groove.  ATTENDING SURGEON:  Almedia Balls. Ranell Patrick, MD  ASSISTANT:  Dutch Quint, PAS who was scrubbed during the entire procedure, and necessary for satisfactory completion of surgery.  ANESTHESIA:  General anesthesia was used plus interscalene block.  ESTIMATED BLOOD LOSS:  Minimal.  FLUID REPLACEMENT:  1200 mL crystalloid.  COUNTS:  Instrument counts correct.  COMPLICATIONS:  No complications.  ANTIBIOTICS:  Perioperative antibiotics were given.  INDICATIONS:  The patient is a 57 year old male with worsening right shoulder pain secondary to full thickness rotator cuff tear.  The patient has failed conservative management, presents for operative treatment to eliminate pain and restore function.   Informed consent obtained.  DESCRIPTION OF PROCEDURE:  After an adequate level of anesthesia was achieved, the patient was positioned in modified beach chair position.  Right shoulder correctly identified and sterile prep and drape performed.  Timeout called, verifying correct  patient and correct site, we entered the patient's shoulder using standard arthroscopic portals including anterior, posterior and lateral portals.  We identified significant tearing of the superior labrum and extensive tearing of the proximal biceps  intra-articular portion.  We performed a biceps tenotomy with the Arthrocare unit.  We then debrided the superior labrum with a suction shaver.  The  anterior inferior labrum was lightly debrided, posterior labrum looked good.  Articular cartilage in good  shape with minimal chondromalacia.  Subscap looked intact with no signs of damage. Supraspinatus and infraspinatus showed damage on the undersurface, which appeared to be near full thickness. The posterior infraspinatus and teres minor appeared normal.   The scope was placed in the subacromial space.  A thorough bursectomy and acromioplasty was performed creating a type 1 acromial shape with a butcher block technique with a speed bur, we did release the CA ligament for better decompression.  The  supraspinatus did have a full thickness tear as visualized from the bursal surface.  We did do a little coplaning on the distal clavicle as there was some spurring inferiorly that could have impinged the cuff.  Once we completed the subacromial  decompression, we concluded the arthroscopic portion of procedure.  We then made a small mini open incision starting at the anterolateral border of the acromion extending distally about 4 cm in the raphae between the anterior and lateral heads of the  deltoid dissection down through subcutaneous tissues using Bovie.  We divided the deltoid in the raphae between the anterior and lateral heads of the deltoid with a needle tip Bovie.  We then placed our Arthrex retractor revealing the subdeltoid plane.   We removed a little bit more bursa.  We had good visualization of the biceps sheath.  We then delivered the biceps tendon out of the biceps sheath, prepped the floor of the biceps groove with a needle tip Bovie and a Freer elevator getting down to  bleeding bone.  We whipstitched with #2 FiberWire suture to reinforce the tendon.  We then placed a single 1.8 FiberTak suture through the floor of the biceps groove and brought that suture up  in a mattress fashion through the reinforced tendon, tying it  down flush the tunnel.  We took the longitudinal sutures up through the  rotator interval and tied over soft tissue bridge incorporating part of the subscap.  We oversewed with 0 Vicryl figure-of-eight suture x2 to create a nice smooth contour from the  repair site.  Next, we went to the cuff tear.  We placed a total of 3 #2 FiberWire sutures through the free end of the torn rotator cuff tendon.  We freed up the rotator cuff with a Freer elevator on the underside of the cuff.  The rotator cuff could be  mobilized back into an anatomic position.  This was a full thickness tear involving all of supraspinatus just in the anterior infraspinatus.  Once we had the tendon mobilized, we prepped the greater tuberosity with rongeur and curette getting down to  bleeding bone.  We then placed two 2.8 FiberTak suture anchors double loaded with ribbon 1 more posteriorly and 1 more anteriorly at the articular margin of the greater tuberosity.  We brought those sutures up in 2 mattress sutures each.  So a total of 4  mattress sutures to restore the medial part of the footprint, lateral sutures down through drill holes in the bone and tied over the lateral bone bridge.  We had a nice double row repair watertight and very secure throughout a full range of motion with  no impingement.  We irrigated thoroughly.  We then repaired the deltoid anatomically with 0 Vicryl suture followed by 2-0 Vicryl for subcutaneous closure and 4-0 running Monocryl for skin and portals.  Steri-Strips applied followed by sterile dressing.   The patient tolerated surgery well.   PUS D: 11/29/2022 10:10:45 am T: 11/29/2022 11:28:00 am  JOB: [id number]/ [id number]

## 2022-11-29 NOTE — Plan of Care (Signed)
  Problem: Education: Goal: Knowledge of the prescribed therapeutic regimen will improve Outcome: Progressing   Problem: Activity: Goal: Ability to tolerate increased activity will improve Outcome: Progressing   Problem: Pain Management: Goal: Pain level will decrease with appropriate interventions Outcome: Progressing   Problem: Education: Goal: Knowledge of General Education information will improve Description: Including pain rating scale, medication(s)/side effects and non-pharmacologic comfort measures Outcome: Progressing   Problem: Clinical Measurements: Goal: Respiratory complications will improve Outcome: Progressing   Problem: Activity: Goal: Risk for activity intolerance will decrease Outcome: Progressing   Problem: Coping: Goal: Level of anxiety will decrease Outcome: Progressing   Problem: Elimination: Goal: Will not experience complications related to bowel motility Outcome: Progressing   Problem: Pain Managment: Goal: General experience of comfort will improve Outcome: Progressing

## 2022-11-29 NOTE — Anesthesia Procedure Notes (Signed)
Procedure Name: Intubation Date/Time: 11/29/2022 7:42 AM  Performed by: Lezlie Lye, CRNAPre-anesthesia Checklist: Patient identified, Emergency Drugs available, Suction available and Patient being monitored Patient Re-evaluated:Patient Re-evaluated prior to induction Oxygen Delivery Method: Circle System Utilized Preoxygenation: Pre-oxygenation with 100% oxygen Induction Type: IV induction Ventilation: Mask ventilation without difficulty and Oral airway inserted - appropriate to patient size Laryngoscope Size: Miller and 3 Grade View: Grade II Tube type: Oral Tube size: 7.5 mm Number of attempts: 1 Airway Equipment and Method: Stylet and Oral airway Placement Confirmation: ETT inserted through vocal cords under direct vision, positive ETCO2 and breath sounds checked- equal and bilateral Secured at: 24 cm Tube secured with: Tape Dental Injury: Teeth and Oropharynx as per pre-operative assessment

## 2022-11-29 NOTE — Progress Notes (Signed)
MEWS Progress Note  Patient Details Name: Shawn Owens MRN: [id number] DOB: 10-Jun-1965 Today's Date: 11/29/2022   MEWS Flowsheet Documentation:  Assess: MEWS Score Temp: 97.8 F (36.6 C) BP: (!) 141/88 MAP (mmHg): 104 Pulse Rate: (!) 118 ECG Heart Rate: 100 Resp: 18 Level of Consciousness: Alert SpO2: 93 % O2 Device: Nasal Cannula Patient Activity (if Appropriate): In bed O2 Flow Rate (L/min): 3 L/min Assess: MEWS Score MEWS Temp: 0 MEWS Systolic: 0 MEWS Pulse: 2 MEWS RR: 0 MEWS LOC: 0 MEWS Score: 2 MEWS Score Color: Yellow Assess: SIRS CRITERIA SIRS Temperature : 0 SIRS Respirations : 0 SIRS Pulse: 1 SIRS WBC: 0 SIRS Score Sum : 1 SIRS Temperature : 0 SIRS Pulse: 1 SIRS Respirations : 0 SIRS WBC: 0 SIRS Score Sum : 1 Assess: if the MEWS score is Yellow or Red Were vital signs accurate and taken at a resting state?: Yes Does the patient meet 2 or more of the SIRS criteria?: No MEWS guidelines implemented : Yes, yellow Treat MEWS Interventions: Considered administering scheduled or prn medications/treatments as ordered Take Vital Signs Increase Vital Sign Frequency : Yellow: Q2hr x1, continue Q4hrs until patient remains green for 12hrs Escalate MEWS: Escalate: Yellow: Discuss with charge nurse and consider notifying provider and/or RRT Provider Notification Provider Name/Title: Angelica Ran Date Provider Notified: 11/29/22 Time Provider Notified: 1934 Method of Notification: Call Notification Reason: Other (Comment) (yellow mews) Provider response: See new orders Date of Provider Response: 11/29/22 Time of Provider Response: 1937   Angelica Ran notified with orders received. D Susann Givens RN   [place], Shawn Owens 11/29/2022, 7:37 PM

## 2022-11-29 NOTE — Interval H&P Note (Signed)
History and Physical Interval Note:  11/29/2022 7:13 AM  Shawn Owens  has presented today for surgery, with the diagnosis of Right shoulder rotator cuff tear.  The various methods of treatment have been discussed with the patient and family. After consideration of risks, benefits and other options for treatment, the patient has consented to  Procedure(s) with comments: SHOULDER ARTHROSCOPY WITH SUBACROMIAL DECOMPRESSION (Right) - interscalene block  120 need 1st assistant due to no PA MINI OPEN ROTATOR CUFF REPAIR (Right) - interscalene block  120 need 1st assistant due to no PA as a surgical intervention.  The patient's history has been reviewed, patient examined, no change in status, stable for surgery.  I have reviewed the patient's chart and labs.  Questions were answered to the patient's satisfaction.     Verlee Rossetti

## 2022-11-29 NOTE — Anesthesia Postprocedure Evaluation (Signed)
Anesthesia Post Note  Patient: Shawn Owens  Procedure(s) Performed: SHOULDER ARTHROSCOPY WITH SUBACROMIAL DECOMPRESSION (Right: Shoulder) MINI OPEN ROTATOR CUFF REPAIR (Right: Shoulder)     Patient location during evaluation: PACU Anesthesia Type: General Level of consciousness: awake and alert Pain management: pain level controlled Vital Signs Assessment: post-procedure vital signs reviewed and stable Respiratory status: spontaneous breathing, nonlabored ventilation, respiratory function stable and patient connected to nasal cannula oxygen Cardiovascular status: blood pressure returned to baseline and stable Postop Assessment: no apparent nausea or vomiting Anesthetic complications: no   No notable events documented.  Last Vitals:  Vitals:   11/29/22 1400 11/29/22 1421  BP:  115/86  Pulse: 97 100  Resp:  20  Temp:  (!) 36.4 C  SpO2: 95% 98%    Last Pain:  Vitals:   11/29/22 1421  TempSrc: Oral  PainSc:                  Mariann Barter

## 2022-11-29 NOTE — Transfer of Care (Signed)
Immediate Anesthesia Transfer of Care Note  Patient: Shawn Owens  Procedure(s) Performed: SHOULDER ARTHROSCOPY WITH SUBACROMIAL DECOMPRESSION (Right: Shoulder) MINI OPEN ROTATOR CUFF REPAIR (Right: Shoulder)  Patient Location: PACU  Anesthesia Type:General  Level of Consciousness: sedated and drowsy  Airway & Oxygen Therapy: Patient Spontanous Breathing and Patient connected to face mask oxygen  Post-op Assessment: Report given to RN and Post -op Vital signs reviewed and stable  Post vital signs: Reviewed and stable  Last Vitals:  Vitals Value Taken Time  BP 143/80 11/29/22 1001  Temp    Pulse 101 11/29/22 1005  Resp    SpO2 95 % 11/29/22 1005  Vitals shown include unfiled device data.  Last Pain:  Vitals:   11/29/22 0611  TempSrc: Oral  PainSc:       Patients Stated Pain Goal: 3 (11/29/22 0981)  Complications: No notable events documented.

## 2022-11-29 NOTE — Brief Op Note (Signed)
11/29/2022  10:04 AM  PATIENT:  Shawn Owens  57 y.o. male  PRE-OPERATIVE DIAGNOSIS:  Right shoulder rotator cuff tear  POST-OPERATIVE DIAGNOSIS:  Right shoulder rotator cuff tear, SLAP TEAR  PROCEDURE:  Right shoulder arthroscopy with arthroscopic subacromial decompression, mini open RCR and biceps tenodesis   SURGEON:  Surgeons and Role:    Beverely Low, MD - Primary  PHYSICIAN ASSISTANT:   ASSISTANTS: Dutch Quint PA-S   ANESTHESIA:   regional and general  EBL:  30 mL   BLOOD ADMINISTERED:none  DRAINS: none   LOCAL MEDICATIONS USED:  MARCAINE     SPECIMEN:  No Specimen  DISPOSITION OF SPECIMEN:  N/A  COUNTS:  YES  TOURNIQUET:  * No tourniquets in log *  DICTATION: .Other Dictation: Dictation Number [id number]  PLAN OF CARE: Discharge to home after PACU  PATIENT DISPOSITION:  PACU - hemodynamically stable.   Delay start of Pharmacological VTE agent (>24hrs) due to surgical blood loss or risk of bleeding: not applicable

## 2022-11-29 NOTE — Anesthesia Preprocedure Evaluation (Signed)
Anesthesia Evaluation  Patient identified by MRN, date of birth, ID band Patient awake    Reviewed: Allergy & Precautions, NPO status , Patient's Chart, lab work & pertinent test results, reviewed documented beta blocker date and time   History of Anesthesia Complications Negative for: history of anesthetic complications  Airway Mallampati: II  TM Distance: >3 FB Neck ROM: Full    Dental  (+) Missing, Poor Dentition   Pulmonary shortness of breath and with exertion, asthma    breath sounds clear to auscultation       Cardiovascular hypertension, (-) angina + DOE  (-) CAD, (-) Past MI, (-) Cardiac Stents, (-) CABG, (-) CHF, (-) Orthopnea and (-) DVT (-) dysrhythmias  Rhythm:Regular Rate:Normal     Neuro/Psych neg Seizures PSYCHIATRIC DISORDERS Anxiety        GI/Hepatic ,GERD  ,,(+) neg Cirrhosis        Endo/Other  neg diabetes    Renal/GU Renal disease     Musculoskeletal   Abdominal   Peds  Hematology   Anesthesia Other Findings   Reproductive/Obstetrics                              Anesthesia Physical Anesthesia Plan  ASA: 2  Anesthesia Plan: General   Post-op Pain Management: Regional block*   Induction: Intravenous  PONV Risk Score and Plan: 2 and Ondansetron and Dexamethasone  Airway Management Planned: Oral ETT  Additional Equipment:   Intra-op Plan:   Post-operative Plan: Extubation in OR  Informed Consent: I have reviewed the patients History and Physical, chart, labs and discussed the procedure including the risks, benefits and alternatives for the proposed anesthesia with the patient or authorized representative who has indicated his/her understanding and acceptance.     Dental advisory given  Plan Discussed with: CRNA  Anesthesia Plan Comments:          Anesthesia Quick Evaluation

## 2022-11-29 NOTE — Discharge Instructions (Signed)
Ice to the shoulder constantly.  Keep the incision covered and clean and dry for one week, then ok to get it wet in the shower.  Do exercise as instructed several times per day.  DO NOT reach behind your back or push up out of a chair with the operative arm.  Use a sling while you are up and around for comfort, may remove while seated.  Keep pillow propped behind the operative elbow.  Follow up with Dr Veverly Fells in two weeks in the office, call [phone] for appt

## 2022-11-30 MED ORDER — DOCUSATE SODIUM 100 MG PO CAPS: 100.0000 mg | ORAL_CAPSULE | Freq: Two times a day (BID) | ORAL | 0 refills | Status: DC | PRN

## 2022-11-30 NOTE — Progress Notes (Addendum)
Subjective: 1 Day Post-Op Procedure(s) (LRB): SHOULDER ARTHROSCOPY WITH SUBACROMIAL DECOMPRESSION (Right) MINI OPEN ROTATOR CUFF REPAIR (Right) Patient reports pain as moderate to severe. Reports block is mostly worn off at this point. Denies numbness or tingling. Reports some burning pain into the arm. No N/V, CP, SOB. Not sure if he feels ready to go home today, anxious about pain control.  Objective: Vital signs in last 24 hours: Temp:  [97.5 F (36.4 C)-98.5 F (36.9 C)] 98.5 F (36.9 C) (10/05 0918) Pulse Rate:  [88-118] 97 (10/05 0918) Resp:  [12-20] 18 (10/05 0918) BP: (103-159)/(61-140) 150/87 (10/05 0918) SpO2:  [88 %-98 %] 95 % (10/05 0918)  Intake/Output from previous day: 10/04 0701 - 10/05 0700 In: 2722.4 [P.O.:960; I.V.:1407.4; IV Piggyback:355] Out: 1655 [Urine:1625; Blood:30] Intake/Output this shift: Total I/O In: 480 [P.O.:480] Out: -   No results for input(s): "HGB" in the last 72 hours. No results for input(s): "WBC", "RBC", "HCT", "PLT" in the last 72 hours. No results for input(s): "NA", "K", "CL", "CO2", "BUN", "CREATININE", "GLUCOSE", "CALCIUM" in the last 72 hours. No results for input(s): "LABPT", "INR" in the last 72 hours.  Neurologically intact ABD soft Neurovascular intact Sensation intact distally Intact pulses distally Dorsiflexion/Plantar flexion intact Incision: dressing C/D/I and no drainage No cellulitis present Compartment soft No sign of DVT   Assessment/Plan: 1 Day Post-Op Procedure(s) (LRB): SHOULDER ARTHROSCOPY WITH SUBACROMIAL DECOMPRESSION (Right) MINI OPEN ROTATOR CUFF REPAIR (Right) Advance diet Up with therapy D/C IV fluids Plan D/C after PT as long as pain remains well controlled, otherwise hold D/C until tomorrow Will need dressing change prior to D/C Please call or text me [phone] if need to cancel d/c or with any issues   Dorothy Spark 11/30/2022, 9:35 AM

## 2022-11-30 NOTE — Discharge Summary (Signed)
Patient ID: Shawn Owens MRN: [id number] DOB/AGE: 1965-08-06 57 y.o.  Admit date: 11/29/2022 Discharge date: 11/30/2022  Admission Diagnoses:  Principal Problem:   S/P complete repair of rotator cuff Active Problems:   Rotator cuff disorder, right   Discharge Diagnoses:  Same  Past Medical History:  Diagnosis Date   Anxiety    Arthritis    knees and hips   Asthma    Closed fracture of calcaneus 06/03/2021   left   Dyspnea    GERD (gastroesophageal reflux disease)    Hypertension    Pneumonia    hx of about 3 or 4 times in his lifetime, per pt    Surgeries: Procedure(s): SHOULDER ARTHROSCOPY WITH SUBACROMIAL DECOMPRESSION MINI OPEN ROTATOR CUFF REPAIR on 11/29/2022   Consultants:   Discharged Condition: Improved  Hospital Course: Pleasant Dobin is an 57 y.o. male who was admitted 11/29/2022 for operative treatment ofS/P complete repair of rotator cuff. Patient has severe unremitting pain that affects sleep, daily activities, and work/hobbies. After pre-op clearance the patient was taken to the operating room on 11/29/2022 and underwent  Procedure(s): SHOULDER ARTHROSCOPY WITH SUBACROMIAL DECOMPRESSION MINI OPEN ROTATOR CUFF REPAIR.    Patient was given perioperative antibiotics:  Anti-infectives (From admission, onward)    Start     Dose/Rate Route Frequency Ordered Stop   11/30/22 1000  acyclovir (ZOVIRAX) tablet 400 mg        400 mg Oral Daily 11/29/22 1427     11/29/22 1600  ceFAZolin (ANCEF) IVPB 2g/100 mL premix        2 g 200 mL/hr over 30 Minutes Intravenous Every 8 hours 11/29/22 1427 11/30/22 0745   11/29/22 0600  ceFAZolin (ANCEF) IVPB 2g/100 mL premix        2 g 200 mL/hr over 30 Minutes Intravenous On call to O.R. 11/29/22 4098 11/29/22 0816        Patient was given sequential compression devices, early ambulation, and chemoprophylaxis to prevent DVT.  Patient benefited maximally from hospital stay and there were no complications.    Recent  vital signs: Patient Vitals for the past 24 hrs:  BP Temp Temp src Pulse Resp SpO2  11/30/22 0918 (!) 150/87 98.5 F (36.9 C) -- 97 18 95 %  11/30/22 0502 (!) 159/109 97.9 F (36.6 C) -- 93 15 97 %  11/30/22 0143 (!) 130/97 97.8 F (36.6 C) -- 97 15 96 %  11/29/22 2312 (!) 127/99 97.9 F (36.6 C) -- (!) 109 16 96 %  11/29/22 1849 -- -- -- -- -- 93 %  11/29/22 1829 (!) 141/88 -- -- (!) 118 -- 96 %  11/29/22 1730 -- -- -- (!) 110 -- 97 %  11/29/22 1729 (!) 139/101 97.8 F (36.6 C) -- (!) 110 18 98 %  11/29/22 1421 115/86 (!) 97.5 F (36.4 C) Oral 100 20 98 %  11/29/22 1400 -- -- -- 97 -- 95 %  11/29/22 1345 (!) 157/140 -- -- (!) 102 -- 95 %  11/29/22 1330 (!) 147/111 97.8 F (36.6 C) -- 98 20 95 %  11/29/22 1315 (!) 131/97 -- -- 99 20 94 %  11/29/22 1300 (!) 140/102 -- -- 100 18 93 %  11/29/22 1245 (!) 147/98 -- -- 98 20 92 %  11/29/22 1230 136/78 -- -- 96 16 (!) 88 %  11/29/22 1215 -- -- -- 94 -- 94 %  11/29/22 1200 135/85 97.8 F (36.6 C) -- 94 20 94 %  11/29/22 1145 127/88 -- -- 96  18 93 %  11/29/22 1130 -- -- -- 97 -- 95 %  11/29/22 1115 123/61 -- -- (!) 101 15 96 %  11/29/22 1100 -- -- -- -- 18 --  11/29/22 1045 (!) 158/88 -- -- 94 -- 97 %  11/29/22 1030 112/89 -- -- 88 -- 95 %  11/29/22 1015 103/86 -- -- (!) 114 20 92 %  11/29/22 1002 (!) 143/80 97.8 F (36.6 C) -- 97 12 96 %     Recent laboratory studies: No results for input(s): "WBC", "HGB", "HCT", "PLT", "NA", "K", "CL", "CO2", "BUN", "CREATININE", "GLUCOSE", "INR", "CALCIUM" in the last 72 hours.  Invalid input(s): "PT", "2"   Discharge Medications:   Allergies as of 11/30/2022   No Known Allergies      Medication List     TAKE these medications    acetaminophen 325 MG tablet Commonly known as: TYLENOL Take 325 mg by mouth every 4 (four) hours as needed for moderate pain.   acyclovir 400 MG tablet Commonly known as: ZOVIRAX Take 400 mg by mouth daily.   albuterol 108 (90 Base) MCG/ACT  inhaler Commonly known as: VENTOLIN HFA Inhale 2 puffs into the lungs every 6 (six) hours as needed for wheezing or shortness of breath.   ALPRAZolam 0.5 MG tablet Commonly known as: XANAX Take 0.5 mg by mouth daily.   Cymbalta 60 MG capsule Generic drug: DULoxetine Take 60 mg by mouth daily.   diclofenac 75 MG EC tablet Commonly known as: VOLTAREN Take 75 mg by mouth 2 (two) times daily.   docusate sodium 100 MG capsule Commonly known as: COLACE Take 1 capsule (100 mg total) by mouth 2 (two) times daily as needed for mild constipation.   famotidine 20 MG tablet Commonly known as: PEPCID Take 20 mg by mouth daily.   fluticasone 220 MCG/ACT inhaler Commonly known as: FLOVENT HFA Inhale 1 puff into the lungs 2 (two) times daily.   gabapentin 600 MG tablet Commonly known as: NEURONTIN Take 600 mg by mouth 3 (three) times daily.   ibuprofen 600 MG tablet Commonly known as: ADVIL Take 600 mg by mouth daily.   losartan 50 MG tablet Commonly known as: COZAAR Take 50 mg by mouth daily.   meloxicam 15 MG tablet Commonly known as: MOBIC Take 15 mg by mouth daily.   methocarbamol 500 MG tablet Commonly known as: ROBAXIN Take 1 tablet (500 mg total) by mouth every 8 (eight) hours as needed for muscle spasms.   omeprazole 20 MG capsule Commonly known as: PRILOSEC Take 20 mg by mouth daily.   ondansetron 4 MG tablet Commonly known as: Zofran Take 1 tablet (4 mg total) by mouth every 8 (eight) hours as needed for vomiting, refractory nausea / vomiting or nausea.   oxyCODONE-acetaminophen 5-325 MG tablet Commonly known as: Percocet Take 1-2 tablets by mouth every 4 (four) hours as needed for severe pain.   Symbicort 160-4.5 MCG/ACT inhaler Generic drug: budesonide-formoterol Inhale 2 puffs into the lungs 2 (two) times daily.   triamcinolone lotion 0.1 % Commonly known as: KENALOG Apply 1 Application topically 2 (two) times daily.        Diagnostic Studies: No  results found.  Disposition: Discharge disposition: 01-Home or Self Care       Discharge Instructions     Call MD / Call 911   Complete by: As directed    If you experience chest pain or shortness of breath, CALL 911 and be transported to the hospital emergency room.  If you develope a fever above 101 F, pus (white drainage) or increased drainage or redness at the wound, or calf pain, call your surgeon's office.   Call MD / Call 911   Complete by: As directed    If you experience chest pain or shortness of breath, CALL 911 and be transported to the hospital emergency room.  If you develope a fever above 101 F, pus (white drainage) or increased drainage or redness at the wound, or calf pain, call your surgeon's office.   Constipation Prevention   Complete by: As directed    Drink plenty of fluids.  Prune juice may be helpful.  You may use a stool softener, such as Colace (over the counter) 100 mg twice a day.  Use MiraLax (over the counter) for constipation as needed.   Constipation Prevention   Complete by: As directed    Drink plenty of fluids.  Prune juice may be helpful.  You may use a stool softener, such as Colace (over the counter) 100 mg twice a day.  Use MiraLax (over the counter) for constipation as needed.   Diet - low sodium heart healthy   Complete by: As directed    Diet - low sodium heart healthy   Complete by: As directed    Increase activity slowly as tolerated   Complete by: As directed    Increase activity slowly as tolerated   Complete by: As directed    Post-operative opioid taper instructions:   Complete by: As directed    POST-OPERATIVE OPIOID TAPER INSTRUCTIONS: It is important to wean off of your opioid medication as soon as possible. If you do not need pain medication after your surgery it is ok to stop day one. Opioids include: Codeine, Hydrocodone(Norco, Vicodin), Oxycodone(Percocet, oxycontin) and hydromorphone amongst others.  Long term and even short  term use of opiods can cause: Increased pain response Dependence Constipation Depression Respiratory depression And more.  Withdrawal symptoms can include Flu like symptoms Nausea, vomiting And more Techniques to manage these symptoms Hydrate well Eat regular healthy meals Stay active Use relaxation techniques(deep breathing, meditating, yoga) Do Not substitute Alcohol to help with tapering If you have been on opioids for less than two weeks and do not have pain than it is ok to stop all together.  Plan to wean off of opioids This plan should start within one week post op of your joint replacement. Maintain the same interval or time between taking each dose and first decrease the dose.  Cut the total daily intake of opioids by one tablet each day Next start to increase the time between doses. The last dose that should be eliminated is the evening dose.      Post-operative opioid taper instructions:   Complete by: As directed    POST-OPERATIVE OPIOID TAPER INSTRUCTIONS: It is important to wean off of your opioid medication as soon as possible. If you do not need pain medication after your surgery it is ok to stop day one. Opioids include: Codeine, Hydrocodone(Norco, Vicodin), Oxycodone(Percocet, oxycontin) and hydromorphone amongst others.  Long term and even short term use of opiods can cause: Increased pain response Dependence Constipation Depression Respiratory depression And more.  Withdrawal symptoms can include Flu like symptoms Nausea, vomiting And more Techniques to manage these symptoms Hydrate well Eat regular healthy meals Stay active Use relaxation techniques(deep breathing, meditating, yoga) Do Not substitute Alcohol to help with tapering If you have been on opioids for less than two weeks and do  not have pain than it is ok to stop all together.  Plan to wean off of opioids This plan should start within one week post op of your joint  replacement. Maintain the same interval or time between taking each dose and first decrease the dose.  Cut the total daily intake of opioids by one tablet each day Next start to increase the time between doses. The last dose that should be eliminated is the evening dose.           Follow-up Information     Beverely Low, MD. Call in 2 week(s).   Specialty: Orthopedic Surgery Why: call [phone] for appt Contact information: [address] [place] [address] [place] [address] [phone]                  Signed: Dorothy Spark 11/30/2022, 9:38 AM

## 2022-11-30 NOTE — Progress Notes (Signed)
   11/30/22 0900  TOC Brief Assessment  Insurance and Status Reviewed  Patient has primary care physician Yes  Home environment has been reviewed home with spouse  Prior level of function: independent  Prior/Current Home Services No current home services  Social Determinants of Health Reivew SDOH reviewed no interventions necessary  Readmission risk has been reviewed Yes  Transition of care needs no transition of care needs at this time

## 2022-11-30 NOTE — Evaluation (Signed)
Occupational Therapy Evaluation Patient Details Name: Shawn Owens MRN: [id number] DOB: [birth year] Today's Date: 11/30/2022   History of Present Illness Pt is a 57 year old man admitteed on 10/4 for R shoulder arthroscopy, with subacromial decompression and mini rotator cuff repair. PMH: L calcaneous fx, HTN, anxiety, asthma.   Clinical Impression   Pt is typically independent. Lives with his wife, who works, adult son and 2 small grandchildren Pt presents with significant R shoulder pain, premedicated prior to session. Pt educated in positioning R UE in bed and chair, compensatory strategies for ADLs and donning and doffing shoulder immobilizer as well as wearing schedule. Reinforced with written handout. Pt unable to tolerate R elbow to hand AROM this visit, verbally instructed to perform x 3/day. Will follow acutely.       If plan is discharge home, recommend the following: A little help with walking and/or transfers;A lot of help with bathing/dressing/bathroom;Assistance with cooking/housework;Assist for transportation    Functional Status Assessment  Patient has had a recent decline in their functional status and demonstrates the ability to make significant improvements in function in a reasonable and predictable amount of time.  Equipment Recommendations  None recommended by OT    Recommendations for Other Services       Precautions / Restrictions Precautions Precautions: Shoulder Type of Shoulder Precautions: may perform gentle R hand to face, AROM R elbow to hand Shoulder Interventions: Roe Coombs joy ultra sling;Off for dressing/bathing/exercises Precaution Booklet Issued: Yes (comment) Required Braces or Orthoses: Sling Restrictions Weight Bearing Restrictions: Yes RUE Weight Bearing: Non weight bearing      Mobility Bed Mobility Overal bed mobility: Modified Independent             General bed mobility comments: HOB up, toward L side    Transfers Overall  transfer level: Needs assistance Equipment used: None Transfers: Sit to/from Stand Sit to Stand: Contact guard assist           General transfer comment: pt with dizziness      Balance Overall balance assessment: Needs assistance   Sitting balance-Leahy Scale: Good       Standing balance-Leahy Scale: Fair                             ADL either performed or assessed with clinical judgement   ADL Overall ADL's : Needs assistance/impaired Eating/Feeding: Set up;Sitting   Grooming: Sitting;Set up   Upper Body Bathing: Moderate assistance;Sitting   Lower Body Bathing: Moderate assistance;Sit to/from stand   Upper Body Dressing : Maximal assistance;Sitting   Lower Body Dressing: Moderate assistance;Sit to/from stand   Toilet Transfer: Contact guard assist           Functional mobility during ADLs: Contact guard assist General ADL Comments: Educated pt in positioning R UE in bed and chair, shoulder immobilizer donning and doffing, compensatory strategies for ADLs, NWB on R UE.     Vision Baseline Vision/History: 1 Wears glasses       Perception         Praxis         Pertinent Vitals/Pain Pain Assessment Pain Assessment: Faces Faces Pain Scale: Hurts whole lot Pain Location: R shoulder Pain Descriptors / Indicators: Throbbing Pain Intervention(s): Repositioned, Ice applied, Limited activity within patient's tolerance     Extremity/Trunk Assessment Upper Extremity Assessment Upper Extremity Assessment: Right hand dominant;RUE deficits/detail RUE Deficits / Details: full hand ROM , unable to tolerate elbow exercises RUE:  Unable to fully assess due to pain;Unable to fully assess due to immobilization RUE Coordination: decreased gross motor   Lower Extremity Assessment Lower Extremity Assessment: Overall WFL for tasks assessed   Cervical / Trunk Assessment Cervical / Trunk Assessment: Normal   Communication Communication Communication:  No apparent difficulties   Cognition Arousal: Alert Behavior During Therapy: WFL for tasks assessed/performed Overall Cognitive Status: History of cognitive impairments - at baseline                                 General Comments: reports memory deficits     General Comments       Exercises     Shoulder Instructions      Home Living Family/patient expects to be discharged to:: Private residence Living Arrangements: Spouse/significant other;Children Available Help at Discharge: Family;Available 24 hours/day (son will be with him at all times) Type of Home: House Home Access: Ramped entrance     Home Layout: One level     Bathroom Shower/Tub: Producer, television/film/video: Handicapped height     Home Equipment: Agricultural consultant (2 wheels);Cane - single point;Shower seat;Grab bars - toilet;Grab bars - tub/shower          Prior Functioning/Environment Prior Level of Function : Independent/Modified Independent;Other (comment) (takes care of young grandchildren, disabled)                        OT Problem List: Impaired UE functional use;Pain      OT Treatment/Interventions: Self-care/ADL training;Therapeutic exercise;Therapeutic activities;Patient/family education    OT Goals(Current goals can be found in the care plan section) Acute Rehab OT Goals OT Goal Formulation: With patient Time For Goal Achievement: 12/14/22 Potential to Achieve Goals: Good ADL Goals Pt/caregiver will Perform Home Exercise Program: Increased ROM;Right Upper extremity;Independently (AROM elbow to hand) Additional ADL Goal #1: Pt and caregiver will be knowledgeable in compensatory strategies for ADLs adhering to shoulder precautions. Additional ADL Goal #2: Pt and caregiver will be knowledgeable in donning and doffing R shoulder immobilizer.  OT Frequency: Min 1X/week    Co-evaluation              AM-PAC OT "6 Clicks" Daily Activity     Outcome Measure  Help from another person eating meals?: A Little Help from another person taking care of personal grooming?: A Little Help from another person toileting, which includes using toliet, bedpan, or urinal?: A Little Help from another person bathing (including washing, rinsing, drying)?: A Lot Help from another person to put on and taking off regular upper body clothing?: A Lot Help from another person to put on and taking off regular lower body clothing?: A Lot 6 Click Score: 15   End of Session    Activity Tolerance: Patient limited by pain Patient left: in chair;with call bell/phone within reach  OT Visit Diagnosis: Unsteadiness on feet (R26.81);Pain;Muscle weakness (generalized) (M62.81) Pain - Right/Left: Right Pain - part of body: Shoulder                Time: 7829-5621 OT Time Calculation (min): 42 min Charges:  OT General Charges $OT Visit: 1 Visit OT Evaluation $OT Eval Low Complexity: 1 Low OT Treatments $Self Care/Home Management : 8-22 mins $Therapeutic Exercise: 8-22 mins  Berna Spare, OTR/L Acute Rehabilitation Services Office: [phone]  Evern Bio 11/30/2022, 11:37 AM

## 2022-11-30 NOTE — Plan of Care (Signed)
  Problem: Pain Management: Goal: Pain level will decrease with appropriate interventions Outcome: Progressing   Problem: Activity: Goal: Risk for activity intolerance will decrease Outcome: Progressing   Problem: Elimination: Goal: Will not experience complications related to urinary retention Outcome: Adequate for Discharge

## 2022-12-01 DIAGNOSIS — M170 Bilateral primary osteoarthritis of knee: Secondary | ICD-10-CM | POA: Diagnosis present

## 2022-12-01 DIAGNOSIS — Z79899 Other long term (current) drug therapy: Secondary | ICD-10-CM | POA: Diagnosis not present

## 2022-12-01 DIAGNOSIS — F419 Anxiety disorder, unspecified: Secondary | ICD-10-CM | POA: Diagnosis present

## 2022-12-01 DIAGNOSIS — S43431A Superior glenoid labrum lesion of right shoulder, initial encounter: Secondary | ICD-10-CM | POA: Diagnosis present

## 2022-12-01 DIAGNOSIS — E1165 Type 2 diabetes mellitus with hyperglycemia: Secondary | ICD-10-CM | POA: Diagnosis present

## 2022-12-01 DIAGNOSIS — M160 Bilateral primary osteoarthritis of hip: Secondary | ICD-10-CM | POA: Diagnosis present

## 2022-12-01 DIAGNOSIS — I10 Essential (primary) hypertension: Secondary | ICD-10-CM | POA: Diagnosis present

## 2022-12-01 DIAGNOSIS — M75101 Unspecified rotator cuff tear or rupture of right shoulder, not specified as traumatic: Secondary | ICD-10-CM | POA: Diagnosis present

## 2022-12-01 DIAGNOSIS — Z7951 Long term (current) use of inhaled steroids: Secondary | ICD-10-CM | POA: Diagnosis not present

## 2022-12-01 DIAGNOSIS — Z9889 Other specified postprocedural states: Secondary | ICD-10-CM

## 2022-12-01 DIAGNOSIS — X58XXXA Exposure to other specified factors, initial encounter: Secondary | ICD-10-CM | POA: Diagnosis present

## 2022-12-01 DIAGNOSIS — K219 Gastro-esophageal reflux disease without esophagitis: Secondary | ICD-10-CM | POA: Diagnosis present

## 2022-12-01 NOTE — Progress Notes (Signed)
Subjective: 2 Days Post-Op Procedure(s) (LRB): SHOULDER ARTHROSCOPY WITH SUBACROMIAL DECOMPRESSION (Right) MINI OPEN ROTATOR CUFF REPAIR (Right)  Patient reports pain as appropriately controlled. Denies any new numbness/tingling.   Objective:   VITALS:  Temp:  [97.6 F (36.4 C)-98.5 F (36.9 C)] 97.7 F (36.5 C) (10/06 0600) Pulse Rate:  [76-97] 90 (10/06 0600) Resp:  [16-18] 18 (10/06 0600) BP: (134-155)/(87-102) 155/94 (10/06 0600) SpO2:  [95 %-99 %] 99 % (10/06 0600)  Gen: AAOx3, NAD Comfortable at rest  Right Upper Extremity: Dressings intact AIN/PIN/Ulnar intact SILT throughout Radial, Ulnar + to palp CR < 2s    LABS No results for input(s): "HGB", "WBC", "PLT" in the last 72 hours. No results for input(s): "NA", "K", "CL", "CO2", "BUN", "CREATININE", "GLUCOSE" in the last 72 hours. No results for input(s): "LABPT", "INR" in the last 72 hours.   Assessment/Plan: 2 Days Post-Op Procedure(s) (LRB): SHOULDER ARTHROSCOPY WITH SUBACROMIAL DECOMPRESSION (Right) MINI OPEN ROTATOR CUFF REPAIR (Right)  Advance diet Up with therapy D/C IV fluids Plan D/C tomorrow after PT as long as pain remains well controlled per his request  Netta Cedars 12/01/2022, 7:56 AM

## 2022-12-02 ENCOUNTER — Encounter (HOSPITAL_COMMUNITY): Payer: Self-pay | Admitting: Orthopedic Surgery

## 2022-12-02 NOTE — Progress Notes (Signed)
   Subjective: 3 Days Post-Op Procedure(s) (LRB): SHOULDER ARTHROSCOPY WITH SUBACROMIAL DECOMPRESSION (Right) MINI OPEN ROTATOR CUFF REPAIR (Right)  Pt feeling better today Denies any new symptoms Ready for d/c home Patient reports pain as moderate.  Objective:   VITALS:   Vitals:   12/02/22 0553 12/02/22 0753  BP: (!) 145/100   Pulse: 73   Resp: 17   Temp: 97.7 F (36.5 C)   SpO2: 98% 99%    Right shoulder: incisions and portals healing well Dressing changed to aquacel Nv intact distally Sling in good position No rashes or signs of infection  LABS No results for input(s): "HGB", "HCT", "WBC", "PLT" in the last 72 hours.  No results for input(s): "NA", "K", "BUN", "CREATININE", "GLUCOSE" in the last 72 hours.   Assessment/Plan: 3 Days Post-Op Procedure(s) (LRB): SHOULDER ARTHROSCOPY WITH SUBACROMIAL DECOMPRESSION (Right) MINI OPEN ROTATOR CUFF REPAIR (Right) D/c home today F/u in the office in two weeks Pain management     Brad Antonieta Iba, MPAS [facility] is now [facility]  [place] [address], [address], [place], [place] [address] Phone: [phone] [email or web address] Facebook  [employer]

## 2022-12-02 NOTE — Progress Notes (Signed)
Occupational Therapy Treatment Patient Details Name: Shawn Owens MRN: [id number] DOB: May 17, 1965 Today's Date: 12/02/2022   History of present illness Pt is a 57 year old man admitteed on 10/4 for R shoulder arthroscopy, with subacromial decompression and mini rotator cuff repair. PMH: L calcaneous fx, HTN, anxiety, asthma.   OT comments  Reinforced education in donning and doffing R UE sling, compensatory strategies for ADLs and NWB on R UE. Completed AROM R elbow to hand and instructed to perform x 3 per day. Left handouts in room as pt reports memory deficits. Pt plans to discharge home today.       If plan is discharge home, recommend the following:  A lot of help with bathing/dressing/bathroom;Assistance with cooking/housework   Equipment Recommendations  None recommended by OT    Recommendations for Other Services      Precautions / Restrictions Precautions Precautions: Shoulder Type of Shoulder Precautions: may perform gentle R hand to face, AROM R elbow to hand Shoulder Interventions: Roe Coombs joy ultra sling;Off for dressing/bathing/exercises Precaution Booklet Issued: Yes (comment) Required Braces or Orthoses: Sling Restrictions Weight Bearing Restrictions: Yes RUE Weight Bearing: Non weight bearing       Mobility Bed Mobility Overal bed mobility: Modified Independent             General bed mobility comments: HOB, pt has rented a hospital bed for home    Transfers Overall transfer level: Modified independent Equipment used: None               General transfer comment: denies dizziness     Balance Overall balance assessment: Needs assistance   Sitting balance-Leahy Scale: Good       Standing balance-Leahy Scale: Fair                             ADL either performed or assessed with clinical judgement   ADL Overall ADL's : Needs assistance/impaired     Grooming: Wash/dry hands;Standing;Supervision/safety     Upper Body  Bathing Details (indicate cue type and reason): reviewed UB bathing technique     Upper Body Dressing : Maximal assistance;Sitting Upper Body Dressing Details (indicate cue type and reason): sling, educated in dressing R arm first, undressing last, wife has bought hospital gowns for pt to wear     Toilet Transfer: Supervision/safety;Ambulation           Functional mobility during ADLs: Supervision/safety General ADL Comments: reinforced education in donning and doffing sling as well as wearing schedule    Extremity/Trunk Assessment              Vision       Perception     Praxis      Cognition Arousal: Alert Behavior During Therapy: WFL for tasks assessed/performed Overall Cognitive Status: History of cognitive impairments - at baseline                                 General Comments: memory deficits at baseline        Exercises Exercises: Other exercises Other Exercises Other Exercises: AROM R elbow to hand 5x2    Shoulder Instructions       General Comments      Pertinent Vitals/ Pain       Pain Assessment Pain Assessment: Faces Faces Pain Scale: Hurts little more Pain Location: R shoulder Pain Descriptors / Indicators: Grimacing, Guarding Pain  Intervention(s): Monitored during session, Premedicated before session, Repositioned, Ice applied  Home Living                                          Prior Functioning/Environment              Frequency  Min 1X/week        Progress Toward Goals  OT Goals(current goals can now be found in the care plan section)  Progress towards OT goals: Progressing toward goals  Acute Rehab OT Goals OT Goal Formulation: With patient Time For Goal Achievement: 12/14/22 Potential to Achieve Goals: Good  Plan      Co-evaluation                 AM-PAC OT "6 Clicks" Daily Activity     Outcome Measure   Help from another person eating meals?: A Little Help from  another person taking care of personal grooming?: A Little Help from another person toileting, which includes using toliet, bedpan, or urinal?: A Little Help from another person bathing (including washing, rinsing, drying)?: A Lot Help from another person to put on and taking off regular upper body clothing?: A Lot Help from another person to put on and taking off regular lower body clothing?: A Lot 6 Click Score: 15    End of Session Equipment Utilized During Treatment: Other (comment);Gait belt (sling)  OT Visit Diagnosis: Unsteadiness on feet (R26.81);Pain;Muscle weakness (generalized) (M62.81) Pain - Right/Left: Right Pain - part of body: Shoulder   Activity Tolerance Patient tolerated treatment well   Patient Left in chair;with call bell/phone within reach   Nurse Communication          Time: 0928-1000 OT Time Calculation (min): 32 min  Charges: OT General Charges $OT Visit: 1 Visit OT Treatments $Self Care/Home Management : 8-22 mins $Therapeutic Exercise: 8-22 mins  Berna Spare, OTR/L Acute Rehabilitation Services Office: [phone]   Evern Bio 12/02/2022, 10:07 AM

## 2022-12-02 NOTE — Plan of Care (Signed)
  Problem: Activity: Goal: Ability to tolerate increased activity will improve Outcome: Progressing   Problem: Pain Management: Goal: Pain level will decrease with appropriate interventions Outcome: Progressing   

## 2022-12-02 NOTE — Discharge Summary (Signed)
In most cases prophylactic antibiotics for Dental procdeures after total joint surgery are not necessary.  Exceptions are as follows:  1. History of prior total joint infection  2. Severely immunocompromised (Organ Transplant, cancer chemotherapy, Rheumatoid biologic meds such as Humera)  3. Poorly controlled diabetes (A1C &gt; 8.0, blood glucose over 200)  If you have one of these conditions, contact your surgeon for an antibiotic prescription, prior to your dental procedure. Orthopedic Discharge Summary        Physician Discharge Summary  Patient ID: Shawn Owens MRN: [id number] DOB/AGE: 14-Nov-1965 57 y.o.  Admit date: 11/29/2022 Discharge date: 12/02/2022   Procedures:  Procedure(s) (LRB): SHOULDER ARTHROSCOPY WITH SUBACROMIAL DECOMPRESSION (Right) MINI OPEN ROTATOR CUFF REPAIR (Right)  Attending Physician:  Dr. Malon Kindle  Admission Diagnoses:   right rotator cuff tear  Discharge Diagnoses:  right rotator cuff tear   Past Medical History:  Diagnosis Date   Anxiety    Arthritis    knees and hips   Asthma    Closed fracture of calcaneus 06/03/2021   left   Dyspnea    GERD (gastroesophageal reflux disease)    Hypertension    Pneumonia    hx of about 3 or 4 times in his lifetime, per pt    PCP: Lonie Peak, PA-C   Discharged Condition: good  Hospital Course:  Patient underwent the above stated procedure on 11/29/2022. Patient tolerated the procedure well and brought to the recovery room in good condition and subsequently to the floor. Patient had an uncomplicated hospital course and was stable for discharge.   Disposition: Discharge disposition: 01-Home or Self Care      with follow up in 2 weeks    Follow-up Information     Beverely Low, MD. Call in 2 week(s).   Specialty: Orthopedic Surgery Why: call [phone] for appt Contact information: [address] [place] [address] [phone]                  Dental Antibiotics:  In most cases prophylactic antibiotics for Dental procdeures after total joint surgery are not necessary.  Exceptions are as follows:  1. History of prior total joint infection  2. Severely immunocompromised (Organ Transplant, cancer chemotherapy, Rheumatoid biologic meds such as Humera)  3. Poorly controlled diabetes (A1C &gt; 8.0, blood glucose over 200)  If you have one of these conditions, contact your surgeon for an antibiotic prescription, prior to your dental procedure.  Discharge Instructions     Call MD / Call 911   Complete by: As directed    If you experience chest pain or shortness of breath, CALL 911 and be transported to the hospital emergency room.  If you develope a fever above 101 F, pus (white drainage) or increased drainage or redness at the wound, or calf pain, call your surgeon's office.   Call MD / Call 911   Complete by: As directed    If you experience chest pain or shortness of breath, CALL 911 and be transported to the hospital emergency room.  If you develope a fever above 101 F, pus (white drainage) or increased drainage or redness at the wound, or calf pain, call your surgeon's office.   Call MD / Call 911   Complete by: As directed    If you experience chest pain or shortness of breath, CALL 911 and be transported to the hospital emergency room.  If you develope a fever above 101 F, pus (white drainage) or increased  drainage or redness at the wound, or calf pain, call your surgeon's office.   Constipation Prevention   Complete by: As directed    Drink plenty of fluids.  Prune juice may be helpful.  You may use a stool softener, such as Colace (over the counter) 100 mg twice a day.  Use MiraLax (over the counter) for constipation as needed.   Constipation Prevention   Complete by: As directed    Drink plenty of fluids.  Prune juice may be helpful.  You may use a stool softener, such as Colace (over the counter) 100 mg twice a  day.  Use MiraLax (over the counter) for constipation as needed.   Constipation Prevention   Complete by: As directed    Drink plenty of fluids.  Prune juice may be helpful.  You may use a stool softener, such as Colace (over the counter) 100 mg twice a day.  Use MiraLax (over the counter) for constipation as needed.   Diet - low sodium heart healthy   Complete by: As directed    Diet - low sodium heart healthy   Complete by: As directed    Diet - low sodium heart healthy   Complete by: As directed    Increase activity slowly as tolerated   Complete by: As directed    Increase activity slowly as tolerated   Complete by: As directed    Increase activity slowly as tolerated   Complete by: As directed    Post-operative opioid taper instructions:   Complete by: As directed    POST-OPERATIVE OPIOID TAPER INSTRUCTIONS: It is important to wean off of your opioid medication as soon as possible. If you do not need pain medication after your surgery it is ok to stop day one. Opioids include: Codeine, Hydrocodone(Norco, Vicodin), Oxycodone(Percocet, oxycontin) and hydromorphone amongst others.  Long term and even short term use of opiods can cause: Increased pain response Dependence Constipation Depression Respiratory depression And more.  Withdrawal symptoms can include Flu like symptoms Nausea, vomiting And more Techniques to manage these symptoms Hydrate well Eat regular healthy meals Stay active Use relaxation techniques(deep breathing, meditating, yoga) Do Not substitute Alcohol to help with tapering If you have been on opioids for less than two weeks and do not have pain than it is ok to stop all together.  Plan to wean off of opioids This plan should start within one week post op of your joint replacement. Maintain the same interval or time between taking each dose and first decrease the dose.  Cut the total daily intake of opioids by one tablet each day Next start to increase  the time between doses. The last dose that should be eliminated is the evening dose.      Post-operative opioid taper instructions:   Complete by: As directed    POST-OPERATIVE OPIOID TAPER INSTRUCTIONS: It is important to wean off of your opioid medication as soon as possible. If you do not need pain medication after your surgery it is ok to stop day one. Opioids include: Codeine, Hydrocodone(Norco, Vicodin), Oxycodone(Percocet, oxycontin) and hydromorphone amongst others.  Long term and even short term use of opiods can cause: Increased pain response Dependence Constipation Depression Respiratory depression And more.  Withdrawal symptoms can include Flu like symptoms Nausea, vomiting And more Techniques to manage these symptoms Hydrate well Eat regular healthy meals Stay active Use relaxation techniques(deep breathing, meditating, yoga) Do Not substitute Alcohol to help with tapering If you have been on opioids for  less than two weeks and do not have pain than it is ok to stop all together.  Plan to wean off of opioids This plan should start within one week post op of your joint replacement. Maintain the same interval or time between taking each dose and first decrease the dose.  Cut the total daily intake of opioids by one tablet each day Next start to increase the time between doses. The last dose that should be eliminated is the evening dose.      Post-operative opioid taper instructions:   Complete by: As directed    POST-OPERATIVE OPIOID TAPER INSTRUCTIONS: It is important to wean off of your opioid medication as soon as possible. If you do not need pain medication after your surgery it is ok to stop day one. Opioids include: Codeine, Hydrocodone(Norco, Vicodin), Oxycodone(Percocet, oxycontin) and hydromorphone amongst others.  Long term and even short term use of opiods can cause: Increased pain response Dependence Constipation Depression Respiratory  depression And more.  Withdrawal symptoms can include Flu like symptoms Nausea, vomiting And more Techniques to manage these symptoms Hydrate well Eat regular healthy meals Stay active Use relaxation techniques(deep breathing, meditating, yoga) Do Not substitute Alcohol to help with tapering If you have been on opioids for less than two weeks and do not have pain than it is ok to stop all together.  Plan to wean off of opioids This plan should start within one week post op of your joint replacement. Maintain the same interval or time between taking each dose and first decrease the dose.  Cut the total daily intake of opioids by one tablet each day Next start to increase the time between doses. The last dose that should be eliminated is the evening dose.          Allergies as of 12/02/2022   No Known Allergies      Medication List     TAKE these medications    acetaminophen 325 MG tablet Commonly known as: TYLENOL Take 325 mg by mouth every 4 (four) hours as needed for moderate pain.   acyclovir 400 MG tablet Commonly known as: ZOVIRAX Take 400 mg by mouth daily.   albuterol 108 (90 Base) MCG/ACT inhaler Commonly known as: VENTOLIN HFA Inhale 2 puffs into the lungs every 6 (six) hours as needed for wheezing or shortness of breath.   ALPRAZolam 0.5 MG tablet Commonly known as: XANAX Take 0.5 mg by mouth daily.   Cymbalta 60 MG capsule Generic drug: DULoxetine Take 60 mg by mouth daily.   diclofenac 75 MG EC tablet Commonly known as: VOLTAREN Take 75 mg by mouth 2 (two) times daily.   docusate sodium 100 MG capsule Commonly known as: COLACE Take 1 capsule (100 mg total) by mouth 2 (two) times daily as needed for mild constipation.   famotidine 20 MG tablet Commonly known as: PEPCID Take 20 mg by mouth daily.   fluticasone 220 MCG/ACT inhaler Commonly known as: FLOVENT HFA Inhale 1 puff into the lungs 2 (two) times daily.   gabapentin 600 MG  tablet Commonly known as: NEURONTIN Take 600 mg by mouth 3 (three) times daily.   ibuprofen 600 MG tablet Commonly known as: ADVIL Take 600 mg by mouth daily.   losartan 50 MG tablet Commonly known as: COZAAR Take 50 mg by mouth daily.   meloxicam 15 MG tablet Commonly known as: MOBIC Take 15 mg by mouth daily.   methocarbamol 500 MG tablet Commonly known as: ROBAXIN Take  1 tablet (500 mg total) by mouth every 8 (eight) hours as needed for muscle spasms.   omeprazole 20 MG capsule Commonly known as: PRILOSEC Take 20 mg by mouth daily.   ondansetron 4 MG tablet Commonly known as: Zofran Take 1 tablet (4 mg total) by mouth every 8 (eight) hours as needed for vomiting, refractory nausea / vomiting or nausea.   oxyCODONE-acetaminophen 5-325 MG tablet Commonly known as: Percocet Take 1-2 tablets by mouth every 4 (four) hours as needed for severe pain.   Symbicort 160-4.5 MCG/ACT inhaler Generic drug: budesonide-formoterol Inhale 2 puffs into the lungs 2 (two) times daily.   triamcinolone lotion 0.1 % Commonly known as: KENALOG Apply 1 Application topically 2 (two) times daily.          Signed: Thea Gist 12/02/2022, 10:16 AM  [facility] is now [employer] [address], [place], [place] [address] Phone: [phone] Facebook  Instagram  [employer]

## 2022-12-02 NOTE — TOC Transition Note (Signed)
Transition of Care [facility]) - CM/SW Discharge Note   Patient Details  Name: Shawn Owens MRN: [id number] Date of Birth: 03/25/1965  Transition of [facility]) CM/SW Contact:  Darleene Cleaver, LCSW Phone Number: 12/02/2022, 10:25 AM   Clinical Narrative:     TOC signing off, not TOC needs.  Final next level of care: Home/Self Care Barriers to Discharge: Barriers Resolved   Patient Goals and CMS Choice      Discharge Placement    Patient returning back home.                     Discharge Plan and Services Additional resources added to the After Visit Summary for                                       Social Determinants of Health (SDOH) Interventions SDOH Screenings   Food Insecurity: No Food Insecurity (11/29/2022)  Housing: Low Risk  (11/29/2022)  Transportation Needs: No Transportation Needs (11/29/2022)  Utilities: Not At Risk (11/29/2022)  Tobacco Use: Low Risk  (11/29/2022)     Readmission Risk Interventions     No data to display

## 2022-12-02 NOTE — Progress Notes (Signed)
Discharge package printed and instructions given to pt and wife. Verbalize understanding. 

## 2023-05-09 ENCOUNTER — Other Ambulatory Visit: Payer: Self-pay | Admitting: General Surgery

## 2023-05-09 DIAGNOSIS — K6389 Other specified diseases of intestine: Secondary | ICD-10-CM

## 2023-05-09 NOTE — Progress Notes (Signed)
 Irish Lack, MD  Claudean Kinds Cancel biopsy request. I messaged provider that we can't get to this mass.  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

## 2023-05-22 ENCOUNTER — Other Ambulatory Visit: Payer: Self-pay | Admitting: General Surgery

## 2023-05-22 DIAGNOSIS — K6389 Other specified diseases of intestine: Secondary | ICD-10-CM

## 2023-05-30 ENCOUNTER — Encounter (HOSPITAL_COMMUNITY)
Admission: RE | Admit: 2023-05-30 | Discharge: 2023-05-30 | Disposition: A | Discharge status: Home / Self Care | Source: Ambulatory Visit | Attending: General Surgery | Admitting: General Surgery

## 2023-05-30 DIAGNOSIS — K6389 Other specified diseases of intestine: Secondary | ICD-10-CM | POA: Diagnosis present

## 2023-05-30 MED ORDER — COPPER CU 64 DOTATATE 1 MCI/ML IV SOLN
4.0000 | Freq: Once | INTRAVENOUS | Status: AC
  Administered 2023-05-30: 4 via INTRAVENOUS

## 2023-06-20 ENCOUNTER — Other Ambulatory Visit: Payer: Self-pay | Admitting: General Surgery

## 2023-06-20 DIAGNOSIS — K6389 Other specified diseases of intestine: Secondary | ICD-10-CM

## 2023-06-26 NOTE — Progress Notes (Signed)
 Surgical Instructions   Your procedure is scheduled on May 6. Report to [facility] Main Entrance "A" at 10:45 A.M., then check in with the Admitting office. Any questions or running late day of surgery: call [phone]  Questions prior to your surgery date: call [phone], Monday-Friday, 8am-4pm. If you experience any cold or flu symptoms such as cough, fever, chills, shortness of breath, etc. between now and your scheduled surgery, please notify us  at the above number.     Remember:  Do not eat after midnight the night before your surgery   You may drink clear liquids until 9:45am the morning of your surgery.   Clear liquids allowed are: Water , Non-Citrus Juices (without pulp), Carbonated Beverages, Clear Tea (no milk, honey, etc.), Black Coffee Only (NO MILK, CREAM OR POWDERED CREAMER of any kind), and Gatorade.    Take these medicines the morning of surgery with A SIP OF WATER   acyclovir  (ZOVIRAX )  DULoxetine  (CYMBALTA )  famotidine  (PEPCID )  gabapentin  (NEURONTIN )  omeprazole (PRILOSEC)  SYMBICORT 160-4.5 MCG/ACT inhaler tamsulosin [facility])   May take these medicines IF NEEDED: albuterol  (VENTOLIN  HFA) 108 (90 Base) MCG/ACT inhaler -bring inhaler to the hospital please ALPRAZolam  (XANAX )  ondansetron  (ZOFRAN )   One week prior to surgery, STOP taking any Aspirin  (unless otherwise instructed by your surgeon),diclofenac  (VOLTAREN ), meloxicam  (MOBIC ),Aleve, Naproxen, Ibuprofen , Motrin , Advil , Goody's, BC's, all herbal medications, fish oil, and non-prescription vitamins.                     Do NOT Smoke (Tobacco/Vaping) for 24 hours prior to your procedure.  If you use a CPAP at night, you may bring your mask/headgear for your overnight stay.   You will be asked to remove any contacts, glasses, piercing's, hearing aid's, dentures/partials prior to surgery. Please bring cases for these items if needed.    Patients discharged the day of surgery will not be allowed to drive  home, and someone needs to stay with them for 24 hours.  SURGICAL WAITING ROOM VISITATION Patients may have no more than 2 support people in the waiting area - these visitors may rotate.   Pre-op nurse will coordinate an appropriate time for 1 ADULT support person, who may not rotate, to accompany patient in pre-op.  Children under the age of 23 must have an adult with them who is not the patient and must remain in the main waiting area with an adult.  If the patient needs to stay at the hospital during part of their recovery, the visitor guidelines for inpatient rooms apply.  Please refer to the [facility] website for the visitor guidelines for any additional information.   If you received a COVID test during your pre-op visit  it is requested that you wear a mask when out in public, stay away from anyone that may not be feeling well and notify your surgeon if you develop symptoms. If you have been in contact with anyone that has tested positive in the last 10 days please notify you surgeon.      Pre-operative CHG Bathing Instructions   You can play a key role in reducing the risk of infection after surgery. Your skin needs to be as free of germs as possible. You can reduce the number of germs on your skin by washing with CHG (chlorhexidine  gluconate) soap before surgery. CHG is an antiseptic soap that kills germs and continues to kill germs even after washing.   DO NOT use if you have an allergy to  chlorhexidine /CHG or antibacterial soaps. If your skin becomes reddened or irritated, stop using the CHG and notify one of our RNs at [phone].              TAKE A SHOWER THE NIGHT BEFORE SURGERY AND THE DAY OF SURGERY    Please keep in mind the following:  DO NOT shave, including legs and underarms, 48 hours prior to surgery.   You may shave your face before/day of surgery.  Place clean sheets on your bed the night before surgery Use a clean washcloth (not used since being washed) for  each shower. DO NOT sleep with pet's night before surgery.  CHG Shower Instructions:  Wash your face and private area with normal soap. If you choose to wash your hair, wash first with your normal shampoo.  After you use shampoo/soap, rinse your hair and body thoroughly to remove shampoo/soap residue.  Turn the water  OFF and apply half the bottle of CHG soap to a CLEAN washcloth.  Apply CHG soap ONLY FROM YOUR NECK DOWN TO YOUR TOES (washing for 3-5 minutes)  DO NOT use CHG soap on face, private areas, open wounds, or sores.  Pay special attention to the area where your surgery is being performed.  If you are having back surgery, having someone wash your back for you may be helpful. Wait 2 minutes after CHG soap is applied, then you may rinse off the CHG soap.  Pat dry with a clean towel  Put on clean pajamas    Additional instructions for the day of surgery: DO NOT APPLY any lotions, deodorants, cologne, or perfumes.   Do not wear jewelry or makeup Do not wear nail polish, gel polish, artificial nails, or any other type of covering on natural nails (fingers and toes) Do not bring valuables to the hospital. [facility] is not responsible for valuables/personal belongings. Put on clean/comfortable clothes.  Please brush your teeth.  Ask your nurse before applying any prescription medications to the skin.

## 2023-06-27 ENCOUNTER — Encounter (HOSPITAL_COMMUNITY)
Admission: RE | Admit: 2023-06-27 | Discharge: 2023-06-27 | Disposition: A | Discharge status: Home / Self Care | Source: Ambulatory Visit | Attending: General Surgery | Admitting: General Surgery

## 2023-06-27 ENCOUNTER — Other Ambulatory Visit: Payer: Self-pay

## 2023-06-27 ENCOUNTER — Encounter (HOSPITAL_COMMUNITY): Payer: Self-pay

## 2023-06-27 VITALS — BP 142/95 | HR 95 | Temp 97.7°F | Resp 18 | Ht 70.0 in | Wt 224.0 lb

## 2023-06-27 DIAGNOSIS — Z01818 Encounter for other preprocedural examination: Secondary | ICD-10-CM

## 2023-06-27 DIAGNOSIS — Z01812 Encounter for preprocedural laboratory examination: Secondary | ICD-10-CM | POA: Diagnosis not present

## 2023-06-27 DIAGNOSIS — K6389 Other specified diseases of intestine: Secondary | ICD-10-CM | POA: Insufficient documentation

## 2023-06-27 LAB — SURGICAL PCR SCREEN
MRSA, PCR: NEGATIVE
Staphylococcus aureus: POSITIVE — AB

## 2023-06-27 LAB — CBC WITH DIFFERENTIAL/PLATELET
Abs Immature Granulocytes: 0.02 10*3/uL (ref 0.00–0.07)
Basophils Absolute: 0.1 10*3/uL (ref 0.0–0.1)
Basophils Relative: 1 %
Eosinophils Absolute: 0.2 10*3/uL (ref 0.0–0.5)
Eosinophils Relative: 3 %
HCT: 48.4 % (ref 39.0–52.0)
Hemoglobin: 15.5 g/dL (ref 13.0–17.0)
Immature Granulocytes: 0 %
Lymphocytes Relative: 20 %
Lymphs Abs: 1.5 10*3/uL (ref 0.7–4.0)
MCH: 28.5 pg (ref 26.0–34.0)
MCHC: 32 g/dL (ref 30.0–36.0)
MCV: 89.1 fL (ref 80.0–100.0)
Monocytes Absolute: 0.8 10*3/uL (ref 0.1–1.0)
Monocytes Relative: 10 %
Neutro Abs: 5 10*3/uL (ref 1.7–7.7)
Neutrophils Relative %: 66 %
Platelets: 209 10*3/uL (ref 150–400)
RBC: 5.43 MIL/uL (ref 4.22–5.81)
RDW: 13.6 % (ref 11.5–15.5)
WBC: 7.6 10*3/uL (ref 4.0–10.5)
nRBC: 0 % (ref 0.0–0.2)

## 2023-06-27 LAB — COMPREHENSIVE METABOLIC PANEL WITH GFR
ALT: 22 U/L (ref 0–44)
AST: 24 U/L (ref 15–41)
Albumin: 3.6 g/dL (ref 3.5–5.0)
Alkaline Phosphatase: 53 U/L (ref 38–126)
Anion gap: 10 (ref 5–15)
BUN: 21 mg/dL — ABNORMAL HIGH (ref 6–20)
CO2: 26 mmol/L (ref 22–32)
Calcium: 9.5 mg/dL (ref 8.9–10.3)
Chloride: 106 mmol/L (ref 98–111)
Creatinine, Ser: 1.47 mg/dL — ABNORMAL HIGH (ref 0.61–1.24)
GFR, Estimated: 56 mL/min — ABNORMAL LOW (ref >60)
Glucose, Bld: 120 mg/dL — ABNORMAL HIGH (ref 70–99)
Potassium: 5 mmol/L (ref 3.5–5.1)
Sodium: 142 mmol/L (ref 135–145)
Total Bilirubin: 0.9 mg/dL (ref 0.0–1.2)
Total Protein: 6.5 g/dL (ref 6.5–8.1)

## 2023-06-27 LAB — TYPE AND SCREEN
ABO/RH(D): O POS
Antibody Screen: NEGATIVE

## 2023-06-27 NOTE — Progress Notes (Signed)
 PCP - Aloha Arnold PA-C Cardiologist - denies   Chest x-ray -  EKG - 11/19/22 Stress Test - denies ECHO - denies Cardiac Cath - denies  Sleep Study - reports home sleep study 3 years ago showed +OSA CPAP - does not own machine  Fasting Blood Sugar - n/a Checks Blood Sugar _____ times a day  Last dose of GLP1 agonist- n/a   GLP1 instructions:   Blood Thinner Instructions: n/a Aspirin  Instructions:  ERAS Protcol - clears until 9:45 PRE-SURGERY Ensure or G2-   Bowel Prep: Pt reports he received instructions of bowel prep including using Miralax prep the day before surgery. Encouraged pt to reference surgeon's instructions and follow prior to arrival.   Patient denies shortness of breath, fever, cough and chest pain at PAT appointment

## 2023-06-30 NOTE — H&P (Signed)
 PROVIDER:  Eppie Hasting, MD Patient Care Team: Practice, [facility] as PCP - General [place] - Shawn Owens as PCP - [place] [facility]   MRN: [id number] DOB: 13-Sep-1965 DATE OF ENCOUNTER: 06/20/2023   Plan Chief Complaint: new cancer (Mesenteric mass per AR)       History of Present Illness: Shawn Owens is a 58 y.o. male who is seen today for mesenteric mass.   Initial history:    Patient is known to our practice because Dr. Melton Owens did a robotic repair of inguinal hernia and umbilical hernia in 2023.  Patient was seen to have a mesenteric mass incidentally on a CT scan that he had while getting worked up for abdominal and back pain.  Due to differences in expertise between Dr. Melton Owens and I, he referred the patient to me.   Interval history:    History of Present Illness Mr. Shawn Owens first noticed discomfort approximately six to eight months ago, initially presenting with severe right-sided pain and subsequent left-sided discomfort. Initial evaluation at [facility] revealed a small kidney stone on the right side and a mass on the left side. However, a subsequent visit to [facility] revealed a larger kidney stone on the right side and multiple stones on the left side. After stent placement and removal, the patient reported some improvement, but persistent pain remained.   The patient also reports lower back pain, exacerbated by sitting with legs hanging off the front of a seat or lying in bed with one leg hanging off the side. The pain is described as severe, akin to a knife being twisted in the back. The patient has a sensitive stomach and cannot tolerate dairy products or heavily seasoned foods.     CT abd/pelvis [place] 02/2023    Dotatate PET 05/30/23 IMPRESSION: 1. No radiotracer activity associated with the central mesenteric mass. 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 An FDG PET scan may be appropriate to define malignant potential  however lesion may represent benign mesenteric mass. 2. Moderate radiotracer activity within the gastric fundus and proximal body. Favor benign physiologic activity; however, with elevated chromogranin A and no additional radiotracer avid lesions on the DOTATATE scan, consider upper endoscopy for further evaluation.   Review of Systems: A complete review of systems was obtained from the patient.  I have reviewed this information and discussed as appropriate with the patient.  See HPI as well for other ROS.   Review of Systems  All other systems reviewed and are negative.       Medical History: Past Medical History      Past Medical History:  Diagnosis Date   Anxiety     Arthritis     Asthma, unspecified asthma severity, unspecified whether complicated, unspecified whether persistent (HHS-HCC)     Genital herpes     GERD (gastroesophageal reflux disease)     Hypertension     Shortness of breath          Problem List     Patient Active Problem List  Diagnosis   BMI 32.0-32.9,adult   Mass of skin of right shoulder   Osteoarthritis   Mesenteric mass        Past Surgical History  History reviewed. No pertinent surgical history.      Allergies  No Known Allergies     Medications Ordered Prior to Encounter        Current Outpatient Medications on File Prior to Visit  Medication Sig Dispense Refill   acyclovir  (ZOVIRAX )  200 MG capsule Take 200 mg by mouth 2 (two) times daily.       albuterol  90 mcg/actuation inhaler Inhale 2 inhalations into the lungs every 4 (four) hours as needed for Wheezing.       famotidine  (PEPCID ) 20 MG tablet         lisinopriL (ZESTRIL) 20 MG tablet Take 1 tablet (20 mg total) by mouth once daily       meloxicam  (MOBIC ) 15 MG tablet         omeprazole (PRILOSEC) 20 MG DR capsule Take 20 mg by mouth 2 (two) times daily.        No current facility-administered medications on file prior to visit.        Family History  History reviewed.  No pertinent family history.      Tobacco Use History  Social History       Tobacco Use  Smoking Status Never  Smokeless Tobacco Never        Social History  Social History        Socioeconomic History   Marital status: Married  Tobacco Use   Smoking status: Never   Smokeless tobacco: Never  Vaping Use   Vaping status: Never Used  Substance and Sexual Activity   Alcohol use: Not Currently   Drug use: Never    Social Drivers of Health        Food Insecurity: No Food Insecurity (11/29/2022)    Received from [facility]    Hunger Vital Sign     Worried About Running Out of Food in the Last Year: Never true     Ran Out of Food in the Last Year: Never true  Transportation Needs: No Transportation Needs (11/29/2022)    Received from [facility] - [facility]     Lack of Transportation (Medical): No     Lack of Transportation (Non-Medical): No        Objective:         Vitals:    06/20/23 1212  PainSc:   4  PainLoc: Back    There is no height or weight on file to calculate BMI.   Head:   Normocephalic and atraumatic.  Eyes:    Conjunctivae are normal. Pupils are equal, round, and reactive to light. No scleral icterus.  Neck:   Normal range of motion. Neck supple. No tracheal deviation present. No thyromegaly present.  Resp:No respiratory distress, normal effort. Abd:      Abdomen is soft. Protuberant.  Mild LLQ tenderness.  No masses are palpable.  There is no rebound and no guarding.  Neurological: Alert and oriented to person, place, and time. Coordination normal.  Skin:    Skin is warm and dry. No rash noted. No diaphoretic. No erythema. No pallor.  Psychiatric: Normal mood and affect. Normal behavior. Judgment and thought content normal.      Labs, Imaging and Diagnostic Testing:   N/a     Assessment and Plan:      Assessment Diagnoses and all orders for this visit:   Mesenteric mass     Assessment & Plan Mesenteric mass has  differential diagnosis which includes malignant processes and benign processes.  It is concerning for neuroendocrine tumor.  The dotatate scan was negative but this does not exclude this from the diagnosis.  The patient has no history of trauma.  This certainly could have been there for a long time but we do not have any previous imaging  to compare.     Given the lack of comparative imaging, it is reasonable to plan resection.  It appears to be resectable.  He will likely require small bowel resection in conjunction with mesenteric mass resection.  I discussed surgery with the patient and his family who are present in the room.  I reviewed that my main concerns are the risk of having to be high upon the mesentery and the risk of requiring a larger than anticipated small bowel resection which may result in diarrhea.  I discussed that there is a risk of damage to adjacent structures, bile leak, possible need for additional surgeries, possible postoperative hernia, possible postop infection, heart or lung issues, blood clot.   I would try to do this robotically if possible.  I will request Dr. Carmell Chiquito assistance with the surgery.       Back pain Chronic lower back pain, likely musculoskeletal, unrelated to mesenteric mass.   Lactose intolerance Lactose intolerance with symptoms from dairy consumption.

## 2023-07-01 ENCOUNTER — Encounter (HOSPITAL_COMMUNITY)
Admission: RE | Disposition: A | Discharge status: Home / Self Care | Payer: Self-pay | Source: Ambulatory Visit | Attending: General Surgery

## 2023-07-01 ENCOUNTER — Inpatient Hospital Stay (HOSPITAL_COMMUNITY)
Admission: RE | Admit: 2023-07-01 | Discharge: 2023-07-05 | DRG: 983 | Disposition: A | Discharge status: Home / Self Care | Source: Ambulatory Visit | Attending: General Surgery | Admitting: General Surgery

## 2023-07-01 ENCOUNTER — Encounter (HOSPITAL_COMMUNITY): Payer: Self-pay | Admitting: General Surgery

## 2023-07-01 ENCOUNTER — Inpatient Hospital Stay (HOSPITAL_COMMUNITY): Admitting: Certified Registered"

## 2023-07-01 ENCOUNTER — Other Ambulatory Visit: Payer: Self-pay

## 2023-07-01 DIAGNOSIS — K219 Gastro-esophageal reflux disease without esophagitis: Secondary | ICD-10-CM | POA: Diagnosis present

## 2023-07-01 DIAGNOSIS — D48114 Desmoid tumor, intraabdominal: Secondary | ICD-10-CM | POA: Diagnosis present

## 2023-07-01 DIAGNOSIS — I10 Essential (primary) hypertension: Secondary | ICD-10-CM | POA: Diagnosis present

## 2023-07-01 DIAGNOSIS — K6389 Other specified diseases of intestine: Principal | ICD-10-CM | POA: Diagnosis present

## 2023-07-01 DIAGNOSIS — E739 Lactose intolerance, unspecified: Secondary | ICD-10-CM | POA: Diagnosis present

## 2023-07-01 DIAGNOSIS — J45909 Unspecified asthma, uncomplicated: Secondary | ICD-10-CM | POA: Diagnosis present

## 2023-07-01 DIAGNOSIS — Z6832 Body mass index (BMI) 32.0-32.9, adult: Secondary | ICD-10-CM | POA: Diagnosis not present

## 2023-07-01 DIAGNOSIS — Z79899 Other long term (current) drug therapy: Secondary | ICD-10-CM

## 2023-07-01 DIAGNOSIS — Z791 Long term (current) use of non-steroidal anti-inflammatories (NSAID): Secondary | ICD-10-CM | POA: Diagnosis not present

## 2023-07-01 DIAGNOSIS — R339 Retention of urine, unspecified: Secondary | ICD-10-CM | POA: Diagnosis not present

## 2023-07-01 DIAGNOSIS — G8929 Other chronic pain: Secondary | ICD-10-CM | POA: Diagnosis present

## 2023-07-01 DIAGNOSIS — E669 Obesity, unspecified: Secondary | ICD-10-CM | POA: Diagnosis present

## 2023-07-01 HISTORY — DX: Other complications of anesthesia, initial encounter: T88.59XA

## 2023-07-01 HISTORY — PX: XI ROBOTIC ASSISTED SMALL BOWEL RESECTION: SHX6872

## 2023-07-01 LAB — CBC
HCT: 40.4 % (ref 39.0–52.0)
Hemoglobin: 13.3 g/dL (ref 13.0–17.0)
MCH: 28.2 pg (ref 26.0–34.0)
MCHC: 32.9 g/dL (ref 30.0–36.0)
MCV: 85.6 fL (ref 80.0–100.0)
Platelets: 194 10*3/uL (ref 150–400)
RBC: 4.72 MIL/uL (ref 4.22–5.81)
RDW: 13.4 % (ref 11.5–15.5)
WBC: 12.3 10*3/uL — ABNORMAL HIGH (ref 4.0–10.5)
nRBC: 0 % (ref 0.0–0.2)

## 2023-07-01 LAB — CREATININE, SERUM
Creatinine, Ser: 1.32 mg/dL — ABNORMAL HIGH (ref 0.61–1.24)
GFR, Estimated: >60 mL/min (ref >60)

## 2023-07-01 LAB — ABO/RH: ABO/RH(D): O POS

## 2023-07-01 SURGERY — EXCISION, SMALL INTESTINE, ROBOT-ASSISTED
Anesthesia: General | Site: Abdomen

## 2023-07-01 MED ORDER — FENTANYL CITRATE (PF) 100 MCG/2ML IJ SOLN
INTRAMUSCULAR | Status: AC
  Filled 2023-07-01: qty 2

## 2023-07-01 MED ORDER — PHENYLEPHRINE 80 MCG/ML (10ML) SYRINGE FOR IV PUSH (FOR BLOOD PRESSURE SUPPORT)
PREFILLED_SYRINGE | INTRAVENOUS | Status: DC | PRN
  Administered 2023-07-01 (×2): 160 ug via INTRAVENOUS

## 2023-07-01 MED ORDER — ALPRAZOLAM 0.5 MG PO TABS
0.5000 mg | ORAL_TABLET | Freq: Every day | ORAL | Status: DC | PRN
  Administered 2023-07-02 – 2023-07-04 (×3): 0.5 mg via ORAL
  Filled 2023-07-01 (×3): qty 1

## 2023-07-01 MED ORDER — CELECOXIB 200 MG PO CAPS
200.0000 mg | ORAL_CAPSULE | Freq: Once | ORAL | Status: AC
  Administered 2023-07-01: 200 mg via ORAL
  Filled 2023-07-01: qty 1

## 2023-07-01 MED ORDER — MIDAZOLAM HCL 2 MG/2ML IJ SOLN
INTRAMUSCULAR | Status: AC
  Filled 2023-07-01: qty 2

## 2023-07-01 MED ORDER — KCL IN DEXTROSE-NACL 20-5-0.45 MEQ/L-%-% IV SOLN
INTRAVENOUS | Status: AC
  Filled 2023-07-01 (×3): qty 1000

## 2023-07-01 MED ORDER — SIMETHICONE 80 MG PO CHEW: 40.0000 mg | CHEWABLE_TABLET | Freq: Four times a day (QID) | ORAL | Status: DC | PRN

## 2023-07-01 MED ORDER — OXYCODONE HCL 5 MG PO TABS
5.0000 mg | ORAL_TABLET | Freq: Once | ORAL | Status: AC | PRN
  Administered 2023-07-01: 5 mg via ORAL

## 2023-07-01 MED ORDER — STERILE WATER FOR IRRIGATION IR SOLN
Status: DC | PRN
  Administered 2023-07-01: 1000 mL

## 2023-07-01 MED ORDER — 0.9 % SODIUM CHLORIDE (POUR BTL) OPTIME
TOPICAL | Status: DC | PRN
  Administered 2023-07-01: 1000 mL

## 2023-07-01 MED ORDER — VASOPRESSIN 20 UNIT/ML IV SOLN
INTRAVENOUS | Status: AC
  Filled 2023-07-01: qty 1

## 2023-07-01 MED ORDER — METHOCARBAMOL 1000 MG/10ML IJ SOLN
500.0000 mg | Freq: Four times a day (QID) | INTRAMUSCULAR | Status: AC
  Administered 2023-07-01 – 2023-07-02 (×4): 500 mg via INTRAVENOUS
  Filled 2023-07-01 (×4): qty 10

## 2023-07-01 MED ORDER — SUGAMMADEX SODIUM 200 MG/2ML IV SOLN
INTRAVENOUS | Status: DC | PRN
  Administered 2023-07-01: 200 mg via INTRAVENOUS

## 2023-07-01 MED ORDER — OXYCODONE HCL 5 MG PO TABS
ORAL_TABLET | ORAL | Status: AC
  Filled 2023-07-01: qty 1

## 2023-07-01 MED ORDER — PANTOPRAZOLE SODIUM 40 MG IV SOLR
40.0000 mg | Freq: Every day | INTRAVENOUS | Status: DC
  Administered 2023-07-01 – 2023-07-02 (×2): 40 mg via INTRAVENOUS
  Filled 2023-07-01 (×2): qty 10

## 2023-07-01 MED ORDER — KETAMINE HCL 50 MG/5ML IJ SOSY
PREFILLED_SYRINGE | INTRAMUSCULAR | Status: AC
  Filled 2023-07-01: qty 5

## 2023-07-01 MED ORDER — BUPIVACAINE-EPINEPHRINE (PF) 0.25% -1:200000 IJ SOLN
INTRAMUSCULAR | Status: AC
  Filled 2023-07-01: qty 30

## 2023-07-01 MED ORDER — ACETAMINOPHEN 500 MG PO TABS
1000.0000 mg | ORAL_TABLET | ORAL | Status: AC
  Administered 2023-07-01: 1000 mg via ORAL
  Filled 2023-07-01: qty 2

## 2023-07-01 MED ORDER — VASOPRESSIN 20 UNIT/ML IV SOLN
INTRAVENOUS | Status: DC | PRN
  Administered 2023-07-01 (×6): 1 [IU] via INTRAVENOUS

## 2023-07-01 MED ORDER — LACTATED RINGERS IV SOLN: INTRAVENOUS | Status: DC | PRN

## 2023-07-01 MED ORDER — FENTANYL CITRATE (PF) 100 MCG/2ML IJ SOLN
25.0000 ug | INTRAMUSCULAR | Status: DC | PRN
  Administered 2023-07-01 (×2): 25 ug via INTRAVENOUS
  Administered 2023-07-01 (×2): 50 ug via INTRAVENOUS

## 2023-07-01 MED ORDER — ONDANSETRON HCL 4 MG/2ML IJ SOLN
INTRAMUSCULAR | Status: DC | PRN
  Administered 2023-07-01: 4 mg via INTRAVENOUS

## 2023-07-01 MED ORDER — MIDAZOLAM HCL 2 MG/2ML IJ SOLN
INTRAMUSCULAR | Status: DC | PRN
  Administered 2023-07-01: 2 mg via INTRAVENOUS

## 2023-07-01 MED ORDER — SODIUM CHLORIDE 0.9 % IR SOLN
Status: DC | PRN
  Administered 2023-07-01: 1000 mL

## 2023-07-01 MED ORDER — BUPIVACAINE-EPINEPHRINE 0.25% -1:200000 IJ SOLN: INTRAMUSCULAR | Status: DC | PRN

## 2023-07-01 MED ORDER — POLYETHYLENE GLYCOL 3350 17 G PO PACK: 17.0000 g | PACK | Freq: Every day | ORAL | Status: DC | PRN

## 2023-07-01 MED ORDER — DIPHENHYDRAMINE HCL 12.5 MG/5ML PO ELIX
12.5000 mg | ORAL_SOLUTION | Freq: Four times a day (QID) | ORAL | Status: DC | PRN
  Administered 2023-07-01: 12.5 mg via ORAL
  Filled 2023-07-01: qty 10

## 2023-07-01 MED ORDER — ORAL CARE MOUTH RINSE: 15.0000 mL | Freq: Once | OROMUCOSAL | Status: AC

## 2023-07-01 MED ORDER — EPINEPHRINE 1 MG/10ML IJ SOSY
PREFILLED_SYRINGE | INTRAMUSCULAR | Status: DC | PRN
  Administered 2023-07-01: 10 ug via INTRAVENOUS

## 2023-07-01 MED ORDER — FENTANYL CITRATE (PF) 250 MCG/5ML IJ SOLN
INTRAMUSCULAR | Status: AC
  Filled 2023-07-01: qty 5

## 2023-07-01 MED ORDER — MELATONIN 3 MG PO TABS: 3.0000 mg | ORAL_TABLET | Freq: Every evening | ORAL | Status: DC | PRN

## 2023-07-01 MED ORDER — ENOXAPARIN SODIUM 40 MG/0.4ML IJ SOSY
40.0000 mg | PREFILLED_SYRINGE | INTRAMUSCULAR | Status: DC
  Administered 2023-07-02 – 2023-07-05 (×4): 40 mg via SUBCUTANEOUS
  Filled 2023-07-01 (×4): qty 0.4

## 2023-07-01 MED ORDER — ONDANSETRON HCL 4 MG/2ML IJ SOLN
4.0000 mg | Freq: Four times a day (QID) | INTRAMUSCULAR | Status: DC | PRN
Start: 2023-07-01 — End: 2023-07-05
  Filled 2023-07-01: qty 2

## 2023-07-01 MED ORDER — FENTANYL CITRATE (PF) 250 MCG/5ML IJ SOLN
INTRAMUSCULAR | Status: DC | PRN
  Administered 2023-07-01: 100 ug via INTRAVENOUS

## 2023-07-01 MED ORDER — LIDOCAINE 2% (20 MG/ML) 5 ML SYRINGE
INTRAMUSCULAR | Status: DC | PRN
  Administered 2023-07-01: 60 mg via INTRAVENOUS

## 2023-07-01 MED ORDER — SODIUM CHLORIDE 0.9 % IV SOLN: 12.5000 mg | INTRAVENOUS | Status: DC | PRN

## 2023-07-01 MED ORDER — DIPHENHYDRAMINE HCL 50 MG/ML IJ SOLN: 12.5000 mg | Freq: Four times a day (QID) | INTRAMUSCULAR | Status: DC | PRN

## 2023-07-01 MED ORDER — MORPHINE SULFATE (PF) 2 MG/ML IV SOLN
1.0000 mg | INTRAVENOUS | Status: DC | PRN
  Administered 2023-07-05: 2 mg via INTRAVENOUS
  Filled 2023-07-01: qty 1

## 2023-07-01 MED ORDER — HYDROMORPHONE HCL 1 MG/ML IJ SOLN
INTRAMUSCULAR | Status: AC
  Filled 2023-07-01: qty 0.5

## 2023-07-01 MED ORDER — LIDOCAINE HCL (PF) 1 % IJ SOLN
INTRAMUSCULAR | Status: AC
  Filled 2023-07-01: qty 30

## 2023-07-01 MED ORDER — AMISULPRIDE (ANTIEMETIC) 5 MG/2ML IV SOLN
10.0000 mg | Freq: Once | INTRAVENOUS | Status: AC | PRN
  Administered 2023-07-01: 10 mg via INTRAVENOUS

## 2023-07-01 MED ORDER — ALBUMIN HUMAN 5 % IV SOLN
INTRAVENOUS | Status: DC | PRN
Start: 2023-07-01 — End: 2023-07-01

## 2023-07-01 MED ORDER — HYDROMORPHONE HCL 1 MG/ML IJ SOLN
INTRAMUSCULAR | Status: DC | PRN
  Administered 2023-07-01: .5 mg via INTRAVENOUS

## 2023-07-01 MED ORDER — ROCURONIUM BROMIDE 10 MG/ML (PF) SYRINGE
PREFILLED_SYRINGE | INTRAVENOUS | Status: DC | PRN
  Administered 2023-07-01 (×3): 50 mg via INTRAVENOUS
  Administered 2023-07-01: 30 mg via INTRAVENOUS

## 2023-07-01 MED ORDER — PROCHLORPERAZINE EDISYLATE 10 MG/2ML IJ SOLN: 5.0000 mg | Freq: Four times a day (QID) | INTRAMUSCULAR | Status: DC | PRN

## 2023-07-01 MED ORDER — KETAMINE HCL 10 MG/ML IJ SOLN
INTRAMUSCULAR | Status: DC | PRN
  Administered 2023-07-01: 20 mg via INTRAVENOUS
  Administered 2023-07-01: 30 mg via INTRAVENOUS

## 2023-07-01 MED ORDER — BUPIVACAINE LIPOSOME 1.3 % IJ SUSP
INTRAMUSCULAR | Status: DC | PRN
  Administered 2023-07-01: 20 mL

## 2023-07-01 MED ORDER — CEFAZOLIN SODIUM-DEXTROSE 2-4 GM/100ML-% IV SOLN
2.0000 g | INTRAVENOUS | Status: AC
  Administered 2023-07-01: 2 g via INTRAVENOUS
  Filled 2023-07-01: qty 100

## 2023-07-01 MED ORDER — CHLORHEXIDINE GLUCONATE 0.12 % MT SOLN
15.0000 mL | Freq: Once | OROMUCOSAL | Status: AC
  Administered 2023-07-01: 15 mL via OROMUCOSAL
  Filled 2023-07-01: qty 15

## 2023-07-01 MED ORDER — LIDOCAINE HCL (PF) 1 % IJ SOLN: INTRAMUSCULAR | Status: DC | PRN

## 2023-07-01 MED ORDER — LACTATED RINGERS IV SOLN: INTRAVENOUS | Status: DC

## 2023-07-01 MED ORDER — BUPIVACAINE LIPOSOME 1.3 % IJ SUSP
INTRAMUSCULAR | Status: AC
  Filled 2023-07-01: qty 20

## 2023-07-01 MED ORDER — OXYCODONE HCL 5 MG PO TABS
5.0000 mg | ORAL_TABLET | ORAL | Status: DC | PRN
  Administered 2023-07-01: 10 mg via ORAL
  Administered 2023-07-02: 5 mg via ORAL
  Administered 2023-07-03: 10 mg via ORAL
  Administered 2023-07-03: 5 mg via ORAL
  Administered 2023-07-04 (×3): 10 mg via ORAL
  Filled 2023-07-01 (×7): qty 2
  Filled 2023-07-01: qty 1

## 2023-07-01 MED ORDER — DULOXETINE HCL 30 MG PO CPEP
30.0000 mg | ORAL_CAPSULE | Freq: Every morning | ORAL | Status: DC
  Administered 2023-07-02 – 2023-07-05 (×4): 30 mg via ORAL
  Filled 2023-07-01 (×4): qty 1

## 2023-07-01 MED ORDER — ONDANSETRON HCL 4 MG/2ML IJ SOLN
INTRAMUSCULAR | Status: AC
  Filled 2023-07-01: qty 2

## 2023-07-01 MED ORDER — DEXAMETHASONE SODIUM PHOSPHATE 10 MG/ML IJ SOLN
INTRAMUSCULAR | Status: DC | PRN
Start: 2023-07-01 — End: 2023-07-01
  Administered 2023-07-01: 10 mg via INTRAVENOUS

## 2023-07-01 MED ORDER — GABAPENTIN 300 MG PO CAPS
600.0000 mg | ORAL_CAPSULE | Freq: Three times a day (TID) | ORAL | Status: DC
  Administered 2023-07-01 – 2023-07-03 (×5): 600 mg via ORAL
  Filled 2023-07-01 (×5): qty 2

## 2023-07-01 MED ORDER — LIDOCAINE HCL 1 % IJ SOLN
INTRAMUSCULAR | Status: DC | PRN
  Administered 2023-07-01: 26 mL

## 2023-07-01 MED ORDER — CHLORHEXIDINE GLUCONATE CLOTH 2 % EX PADS
6.0000 | MEDICATED_PAD | Freq: Once | CUTANEOUS | Status: DC
Start: 2023-07-01 — End: 2023-07-01

## 2023-07-01 MED ORDER — PROPOFOL 10 MG/ML IV BOLUS
INTRAVENOUS | Status: DC | PRN
  Administered 2023-07-01: 200 mg via INTRAVENOUS

## 2023-07-01 MED ORDER — PHENYLEPHRINE HCL-NACL 20-0.9 MG/250ML-% IV SOLN
INTRAVENOUS | Status: DC | PRN
  Administered 2023-07-01: 50 ug/min via INTRAVENOUS

## 2023-07-01 MED ORDER — CHLORHEXIDINE GLUCONATE CLOTH 2 % EX PADS: 6.0000 | MEDICATED_PAD | Freq: Once | CUTANEOUS | Status: DC

## 2023-07-01 MED ORDER — CEFAZOLIN SODIUM-DEXTROSE 2-4 GM/100ML-% IV SOLN
2.0000 g | Freq: Three times a day (TID) | INTRAVENOUS | Status: AC
  Administered 2023-07-01: 2 g via INTRAVENOUS
  Filled 2023-07-01: qty 100

## 2023-07-01 MED ORDER — LOSARTAN POTASSIUM 50 MG PO TABS
50.0000 mg | ORAL_TABLET | Freq: Every morning | ORAL | Status: DC
  Administered 2023-07-02 – 2023-07-05 (×4): 50 mg via ORAL
  Filled 2023-07-01 (×4): qty 1

## 2023-07-01 MED ORDER — DEXMEDETOMIDINE HCL IN NACL 80 MCG/20ML IV SOLN
INTRAVENOUS | Status: DC | PRN
  Administered 2023-07-01: 10 ug via INTRAVENOUS

## 2023-07-01 MED ORDER — ONDANSETRON 4 MG PO TBDP
4.0000 mg | ORAL_TABLET | Freq: Four times a day (QID) | ORAL | Status: DC | PRN
  Administered 2023-07-03: 4 mg via ORAL
  Filled 2023-07-01: qty 1

## 2023-07-01 MED ORDER — ACETAMINOPHEN 500 MG PO TABS
1000.0000 mg | ORAL_TABLET | Freq: Four times a day (QID) | ORAL | Status: DC
  Administered 2023-07-01 – 2023-07-05 (×11): 1000 mg via ORAL
  Filled 2023-07-01 (×12): qty 2

## 2023-07-01 MED ORDER — ESMOLOL HCL 100 MG/10ML IV SOLN
INTRAVENOUS | Status: AC
  Filled 2023-07-01: qty 10

## 2023-07-01 MED ORDER — ALBUTEROL SULFATE (2.5 MG/3ML) 0.083% IN NEBU: 3.0000 mL | INHALATION_SOLUTION | Freq: Four times a day (QID) | RESPIRATORY_TRACT | Status: DC | PRN

## 2023-07-01 MED ORDER — ACYCLOVIR 400 MG PO TABS
400.0000 mg | ORAL_TABLET | Freq: Every morning | ORAL | Status: DC
  Administered 2023-07-02 – 2023-07-05 (×4): 400 mg via ORAL
  Filled 2023-07-01 (×4): qty 1

## 2023-07-01 MED ORDER — ROCURONIUM BROMIDE 10 MG/ML (PF) SYRINGE
PREFILLED_SYRINGE | INTRAVENOUS | Status: AC
  Filled 2023-07-01: qty 20

## 2023-07-01 MED ORDER — PROCHLORPERAZINE MALEATE 10 MG PO TABS: 10.0000 mg | ORAL_TABLET | Freq: Four times a day (QID) | ORAL | Status: DC | PRN

## 2023-07-01 MED ORDER — FLUTICASONE FUROATE-VILANTEROL 200-25 MCG/ACT IN AEPB
1.0000 | INHALATION_SPRAY | Freq: Every day | RESPIRATORY_TRACT | Status: DC
  Administered 2023-07-02 – 2023-07-05 (×4): 1 via RESPIRATORY_TRACT
  Filled 2023-07-01: qty 28

## 2023-07-01 MED ORDER — METOPROLOL TARTRATE 5 MG/5ML IV SOLN: 5.0000 mg | Freq: Four times a day (QID) | INTRAVENOUS | Status: DC | PRN

## 2023-07-01 MED ORDER — OXYCODONE HCL 5 MG/5ML PO SOLN: 5.0000 mg | Freq: Once | ORAL | Status: AC | PRN

## 2023-07-01 MED ORDER — TAMSULOSIN HCL 0.4 MG PO CAPS
0.4000 mg | ORAL_CAPSULE | Freq: Every morning | ORAL | Status: DC
  Administered 2023-07-02 – 2023-07-05 (×4): 0.4 mg via ORAL
  Filled 2023-07-01 (×4): qty 1

## 2023-07-01 MED ORDER — AMISULPRIDE (ANTIEMETIC) 5 MG/2ML IV SOLN
INTRAVENOUS | Status: AC
  Filled 2023-07-01: qty 4

## 2023-07-01 SURGICAL SUPPLY — 69 items
APPLICATOR VISTASEAL 35 (MISCELLANEOUS) IMPLANT
BAG COUNTER SPONGE SURGICOUNT (BAG) IMPLANT
CANNULA REDUCER 12-8 DVNC XI (CANNULA) IMPLANT
CHLORAPREP W/TINT 26 (MISCELLANEOUS) ×1 IMPLANT
CLIP LIGATING HEM O LOK PURPLE (MISCELLANEOUS) IMPLANT
CLIP LIGATING HEMO O LOK GREEN (MISCELLANEOUS) IMPLANT
CLIP LIGATING HEMOLOK MED (MISCELLANEOUS) IMPLANT
COVER MAYO STAND STRL (DRAPES) ×1 IMPLANT
COVER TIP SHEARS 8 DVNC (MISCELLANEOUS) ×1 IMPLANT
DEFOGGER SCOPE WARM SEASHARP (MISCELLANEOUS) ×1 IMPLANT
DERMABOND ADVANCED .7 DNX12 (GAUZE/BANDAGES/DRESSINGS) ×1 IMPLANT
DEVICE TROCAR PUNCTURE CLOSURE (ENDOMECHANICALS) IMPLANT
DRAIN CHANNEL 19F RND (DRAIN) IMPLANT
DRAPE ARM DVNC X/XI (DISPOSABLE) ×4 IMPLANT
DRAPE COLUMN DVNC XI (DISPOSABLE) ×1 IMPLANT
DRAPE CV SPLIT W-CLR ANES SCRN (DRAPES) ×1 IMPLANT
DRAPE SRG 135X102X78XABS (DRAPES) IMPLANT
DRAPE SURG ORHT 6 SPLT 77X108 (DRAPES) IMPLANT
DRIVER NDL LRG 8 DVNC XI (INSTRUMENTS) ×1 IMPLANT
DRIVER NDL MEGA 8 DVNC XI (INSTRUMENTS) ×2 IMPLANT
DRIVER NDL MEGA SUTCUT DVNCXI (INSTRUMENTS) ×1 IMPLANT
DRIVER NDLE LRG 8 DVNC XI (INSTRUMENTS) IMPLANT
DRIVER NDLE MEGA DVNC XI (INSTRUMENTS) IMPLANT
DRIVER NDLE MEGA SUTCUT DVNCXI (INSTRUMENTS) ×1 IMPLANT
EVACUATOR SILICONE 100CC (DRAIN) IMPLANT
FORCEPS BPLR FENES DVNC XI (FORCEP) ×1 IMPLANT
FORCEPS CADIERE DVNC XI (FORCEP) ×1 IMPLANT
GLOVE BIO SURGEON STRL SZ 6 (GLOVE) ×2 IMPLANT
GLOVE INDICATOR 6.5 STRL GRN (GLOVE) ×2 IMPLANT
GOWN STRL REUS W/ TWL LRG LVL3 (GOWN DISPOSABLE) ×3 IMPLANT
GOWN STRL REUS W/ TWL XL LVL3 (GOWN DISPOSABLE) ×2 IMPLANT
GRASPER TIP-UP FEN DVNC XI (INSTRUMENTS) ×1 IMPLANT
IRRIGATION SUCT STRKRFLW 2 WTP (MISCELLANEOUS) ×1 IMPLANT
KIT BASIN OR (CUSTOM PROCEDURE TRAY) ×1 IMPLANT
KIT TURNOVER KIT B (KITS) IMPLANT
NDL HYPO 22X1.5 SAFETY MO (MISCELLANEOUS) ×1 IMPLANT
NDL INSUFFLATION 14GA 120MM (NEEDLE) ×1 IMPLANT
NEEDLE HYPO 22X1.5 SAFETY MO (MISCELLANEOUS) ×1 IMPLANT
NEEDLE INSUFFLATION 14GA 120MM (NEEDLE) ×1 IMPLANT
OBTURATOR OPTICALSTD 8 DVNC (TROCAR) IMPLANT
PAD ARMBOARD POSITIONER FOAM (MISCELLANEOUS) ×1 IMPLANT
PENCIL SMOKE EVACUATOR (MISCELLANEOUS) IMPLANT
POUCH RETRIEVAL ECOSAC 10 (ENDOMECHANICALS) IMPLANT
RELOAD STAPLE 60 3.5 BLU DVNC (STAPLE) IMPLANT
RELOAD STAPLER 3.5X60 BLU DVNC (STAPLE) ×5 IMPLANT
RETRACTOR WOUND ALXS 19CM XSML (INSTRUMENTS) IMPLANT
SCISSORS LAP 5X35 DISP (ENDOMECHANICALS) ×1 IMPLANT
SCISSORS MNPLR CVD DVNC XI (INSTRUMENTS) IMPLANT
SEAL UNIV 5-12 XI (MISCELLANEOUS) ×3 IMPLANT
SEALER VESSEL EXT DVNC XI (MISCELLANEOUS) ×1 IMPLANT
SET TUBE SMOKE EVAC HIGH FLOW (TUBING) ×1 IMPLANT
SLEEVE XCEL OPT CAN 5 100 (ENDOMECHANICALS) IMPLANT
SOLUTION ELECTROSURG ANTI STCK (MISCELLANEOUS) ×1 IMPLANT
SPONGE T-LAP 18X18 ~~LOC~~+RFID (SPONGE) ×1 IMPLANT
STAPLER 60 SUREFORM DVNC (STAPLE) IMPLANT
STOPCOCK 4 WAY LG BORE MALE ST (IV SETS) ×1 IMPLANT
SUT MNCRL AB 4-0 PS2 18 (SUTURE) ×1 IMPLANT
SUT VIC AB 2-0 SH 27X BRD (SUTURE) IMPLANT
SUT VIC AB 3-0 SH 27X BRD (SUTURE) IMPLANT
SUT VIC AB 3-0 SH 27XBRD (SUTURE) IMPLANT
SUT VICRYL 0 TIES 12 18 (SUTURE) IMPLANT
SUT VICRYL 0 UR6 27IN ABS (SUTURE) IMPLANT
SUT VLOC 180 2-0 9IN GS21 (SUTURE) IMPLANT
SYSTEM BAG RETRIEVAL 10MM (BASKET) IMPLANT
TOWEL GREEN STERILE FF (TOWEL DISPOSABLE) ×1 IMPLANT
TRAY FOLEY MTR SLVR 16FR STAT (SET/KITS/TRAYS/PACK) ×1 IMPLANT
TRAY LAPAROSCOPIC MC (CUSTOM PROCEDURE TRAY) ×1 IMPLANT
TROCAR ADV FIXATION 5X100MM (TROCAR) IMPLANT
TROCAR Z-THREAD OPTICAL 5X100M (TROCAR) IMPLANT

## 2023-07-01 NOTE — Anesthesia Preprocedure Evaluation (Addendum)
 Anesthesia Evaluation  Patient identified by MRN, date of birth, ID band Patient awake    Reviewed: Allergy & Precautions, NPO status , Patient's Chart, lab work & pertinent test results  History of Anesthesia Complications Negative for: history of anesthetic complications  Airway Mallampati: II  TM Distance: >3 FB Neck ROM: Full    Dental  (+) Dental Advisory Given   Pulmonary asthma , sleep apnea    Pulmonary exam normal        Cardiovascular hypertension, Pt. on medications Normal cardiovascular exam     Neuro/Psych  PSYCHIATRIC DISORDERS Anxiety     negative neurological ROS     GI/Hepatic Neg liver ROS,GERD  Medicated and Controlled,,  Endo/Other   Obesity   Renal/GU negative Renal ROS     Musculoskeletal  (+) Arthritis ,    Abdominal   Peds  Hematology negative hematology ROS (+)   Anesthesia Other Findings   Reproductive/Obstetrics                             Anesthesia Physical Anesthesia Plan  ASA: 2  Anesthesia Plan: General   Post-op Pain Management: Tylenol  PO (pre-op)*, Celebrex PO (pre-op)*, Ketamine IV* and Precedex    Induction: Intravenous  PONV Risk Score and Plan: 2 and Treatment may vary due to age or medical condition, Ondansetron , Dexamethasone  and Midazolam   Airway Management Planned: Oral ETT  Additional Equipment: None  Intra-op Plan:   Post-operative Plan: Extubation in OR  Informed Consent: I have reviewed the patients History and Physical, chart, labs and discussed the procedure including the risks, benefits and alternatives for the proposed anesthesia with the patient or authorized representative who has indicated his/her understanding and acceptance.     Dental advisory given  Plan Discussed with: CRNA and Anesthesiologist  Anesthesia Plan Comments:         Anesthesia Quick Evaluation

## 2023-07-01 NOTE — Anesthesia Postprocedure Evaluation (Signed)
 Anesthesia Post Note  Patient: Shawn Owens  Procedure(s) Performed: ROBOTIC PROXIMAL SMALL BOWEL RESECTION WITH MESENTERIC MASS RESECTION (Abdomen)     Patient location during evaluation: PACU Anesthesia Type: General Level of consciousness: awake and alert Pain management: pain level controlled Vital Signs Assessment: post-procedure vital signs reviewed and stable Respiratory status: spontaneous breathing, nonlabored ventilation, respiratory function stable and patient connected to nasal cannula oxygen Cardiovascular status: blood pressure returned to baseline and stable Postop Assessment: no apparent nausea or vomiting Anesthetic complications: no   No notable events documented.  Last Vitals:  Vitals:   07/01/23 1715 07/01/23 1730  BP: 90/78 97/65  Pulse: 90 80  Resp: 15 16  Temp:    SpO2: 97% 95%    Last Pain:  Vitals:   07/01/23 1730  PainSc: Asleep                 Juventino Oppenheim

## 2023-07-01 NOTE — Op Note (Signed)
 PRE-OPERATIVE DIAGNOSIS: mesenteric mass  POST-OPERATIVE DIAGNOSIS:  Same  PROCEDURE:  Procedure(s): Robotic resection of mesenteric mass with proximal small bowel resection  SURGEON:  Surgeon(s): Lockie Rima, MD  ASSISTANT:   Shela Derby, MD  ANESTHESIA:   local and general  DRAINS: none   LOCAL MEDICATIONS USED:  BUPIVICAINE   SPECIMEN:  Source of Specimen:  proximal small bowel with mesenteric mass  DISPOSITION OF SPECIMEN:  PATHOLOGY  COUNTS:  YES  DICTATION: .Dragon Dictation  PLAN OF CARE: Admit to inpatient   PATIENT DISPOSITION:  PACU - hemodynamically stable.  FINDINGS: Firm mass of the mesentery proximally 20 to 30 cm beyond the of ligament of Treitz  EBL: 50 mL  PROCEDURE:   Patient was identified in the holding area and taken the operating room where he was placed supine on the operating table.  General anesthesia was induced.  Arms were tucked.  Abdomen was clipped, prepped, and draped in sterile fashion.  A timeout was performed according to the surgical safety checklist.  When all was correct, we continued.    Patient was placed into reverse Trendelenburg position and rotated to the right.  Local anesthetic was infiltrated at the left subcostal margin.  A Veress needle was used to access the abdomen and achieved pneumoperitoneum to pressure 15 mmHg.  An arc of trocar sites was drawn out in the lower abdomen with three 8 mm trocars and one 12 mm trocar as well as a 5 mm assistant trocar in the right upper quadrant.  The 12 mm left lower quadrant trocar was placed first.  The camera was then advanced through this trocar and the remaining trocars were placed under direct visualization.  There was no evidence of injury to the underlying viscera.  The original trocar was upsized to a 12 mm trocar.  The robot was then docked.  The proximal small bowel was evaluated and the location for the mesenteric mass was identified.  There was an umbilicated site in the  proximal mesentery consistent with what had been seen on CT scan.  The small bowel was evaluated proximal and distal to the mesenteric mass.  No definitive small bowel mass was identified.  The mesentery was divided with the vessel sealer.  Care was taken to slowly divide the mesentery to avoid bleeding.  The mesentery was divided just proximal to the calcified mass.  There was a secondary mass that was more soft and rubbery.  This was also incorporated in the specimen.  The robotic stapler was then used to transect the bowel at the distal portion of the resection site.  The specimen was placed to the side.  A stay suture was placed to create a side-to-side, functional end-to-end anastomosis.  As this was being set up it did appear that another inch or so of the distal small bowel appeared to be a bit dusky.  This was trimmed up with another load of the stapler and this appeared to have significantly better vascular supply.  The bowel was then opened on the proximal and distal end with the hot scissors.  The stapler was advanced through both ends and carefully laid into place taking care not to go through the mesentery and to have no tension on the anastomosis.  This was fired.  The inner portion of the anastomosis was evaluated and no evidence of bleeding was seen.  The stapler was removed and the defect was closed with the running 2-0 V-Loc suture.  3-0 Vicryl was used to then  reinforce the suture line.  The 12 mm trocar site was upsized to a small extraction port approximately 3 cm in length.  The small segment of small bowel as well as the original specimen were both removed through this site.  This was done with a wound protector in place.  The peritoneum was then closed using a 0 Vicryl.  The fascia was closed using #1 PDS running.  The peritoneum was infiltrated with Exparel .  The abdomen was reinsufflated and the staple line examined.  There was still good blood supply and there was no tension on the  anastomosis.  It laid nicely in the left upper quadrant.  The omentum was pulled over the top of it and there was no evidence of bleeding or succus in any of the 4 quadrants of the abdomen.  The remainder of the pneumoperitoneum was allowed to evacuate.  The remaining trocars were removed.  The skin of the extraction port was closed using interrupted 3-0 Vicryl deep dermal sutures and then the skin of all the ports was closed with 4-0 Monocryl in a running subcuticular fashion.  The wounds were then cleaned, dried, and dressed with Dermabond.  The patient was allowed to emerge from anesthesia and was taken to the PACU in stable condition.  Needle, sponge, and instrument counts were correct x 2.

## 2023-07-01 NOTE — Anesthesia Procedure Notes (Signed)
 Procedure Name: Intubation Date/Time: 07/01/2023 12:46 PM  Performed by: Katrinka Parr, CRNAPre-anesthesia Checklist: Patient identified, Emergency Drugs available, Suction available and Patient being monitored Patient Re-evaluated:Patient Re-evaluated prior to induction Oxygen Delivery Method: Circle system utilized Preoxygenation: Pre-oxygenation with 100% oxygen Induction Type: IV induction Ventilation: Mask ventilation without difficulty and Oral airway inserted - appropriate to patient size Laryngoscope Size: 4 and Mac Grade View: Grade II Tube type: Oral Tube size: 7.5 mm Number of attempts: 1 Airway Equipment and Method: Stylet and Oral airway Placement Confirmation: ETT inserted through vocal cords under direct vision, positive ETCO2 and breath sounds checked- equal and bilateral Secured at: 22 cm Tube secured with: Tape Dental Injury: Teeth and Oropharynx as per pre-operative assessment

## 2023-07-01 NOTE — Interval H&P Note (Signed)
 History and Physical Interval Note:  07/01/2023 12:17 PM  Shawn Owens  has presented today for surgery, with the diagnosis of MESENTERIC MASS.  The various methods of treatment have been discussed with the patient and family. After consideration of risks, benefits and other options for treatment, the patient has consented to  Procedure(s) with comments: EXCISION, SMALL INTESTINE, ROBOT-ASSISTED (N/A) - ROBOTIC SMALL BOWEL RESECTION WITH MESENTERIC MASS RESECTION as a surgical intervention.  The patient's history has been reviewed, patient examined, no change in status, stable for surgery.  I have reviewed the patient's chart and labs.  Questions were answered to the patient's satisfaction.     Lockie Rima

## 2023-07-01 NOTE — Transfer of Care (Signed)
 Immediate Anesthesia Transfer of Care Note  Patient: Shawn Owens  Procedure(s) Performed: ROBOTIC PROXIMAL SMALL BOWEL RESECTION WITH MESENTERIC MASS RESECTION (Abdomen)  Patient Location: PACU  Anesthesia Type:General  Level of Consciousness: drowsy and patient cooperative  Airway & Oxygen Therapy: Patient Spontanous Breathing  Post-op Assessment: Report given to RN and Post -op Vital signs reviewed and stable  Post vital signs: Reviewed and stable  Last Vitals:  Vitals Value Taken Time  BP 89/59 07/01/23 1607  Temp 36.7 C 07/01/23 1607  Pulse 105 07/01/23 1611  Resp 15 07/01/23 1611  SpO2 90 % 07/01/23 1611  Vitals shown include unfiled device data.  Last Pain:  Vitals:   07/01/23 1107  PainSc: 0-No pain         Complications: No notable events documented.

## 2023-07-02 ENCOUNTER — Encounter (HOSPITAL_COMMUNITY): Payer: Self-pay | Admitting: General Surgery

## 2023-07-02 ENCOUNTER — Other Ambulatory Visit: Payer: Self-pay

## 2023-07-02 LAB — BASIC METABOLIC PANEL WITH GFR
Anion gap: 8 (ref 5–15)
BUN: 15 mg/dL (ref 6–20)
CO2: 25 mmol/L (ref 22–32)
Calcium: 8.5 mg/dL — ABNORMAL LOW (ref 8.9–10.3)
Chloride: 105 mmol/L (ref 98–111)
Creatinine, Ser: 1.19 mg/dL (ref 0.61–1.24)
GFR, Estimated: >60 mL/min (ref >60)
Glucose, Bld: 124 mg/dL — ABNORMAL HIGH (ref 70–99)
Potassium: 4.3 mmol/L (ref 3.5–5.1)
Sodium: 138 mmol/L (ref 135–145)

## 2023-07-02 LAB — CBC
HCT: 39.3 % (ref 39.0–52.0)
Hemoglobin: 12.8 g/dL — ABNORMAL LOW (ref 13.0–17.0)
MCH: 28.2 pg (ref 26.0–34.0)
MCHC: 32.6 g/dL (ref 30.0–36.0)
MCV: 86.6 fL (ref 80.0–100.0)
Platelets: 184 10*3/uL (ref 150–400)
RBC: 4.54 MIL/uL (ref 4.22–5.81)
RDW: 13.4 % (ref 11.5–15.5)
WBC: 14.1 10*3/uL — ABNORMAL HIGH (ref 4.0–10.5)
nRBC: 0 % (ref 0.0–0.2)

## 2023-07-02 NOTE — Progress Notes (Signed)
 1 Day Post-Op   Subjective/Chief Complaint: No n/v.  Passing gas.  Pain ok with oxy and increased gabapentin  from home.     Objective: Vital signs in last 24 hours: Temp:  [97.9 F (36.6 C)-98.1 F (36.7 C)] 97.9 F (36.6 C) (05/07 0501) Pulse Rate:  [72-106] 74 (05/07 0501) Resp:  [10-22] 16 (05/07 0501) BP: (81-144)/(48-98) 102/79 (05/07 0501) SpO2:  [90 %-100 %] 96 % (05/07 0501)    Intake/Output from previous day: 05/06 0701 - 05/07 0700 In: 3494.1 [I.V.:2994.1; IV Piggyback:500] Out: 3670 [Urine:3650; Blood:20] Intake/Output this shift: Total I/O In: 100 [P.O.:100] Out: 400 [Urine:400]  Alert and oriented Breathing comfortably Abd protuberant, but at baseline.  Incisions c/d/I, no significant bruising.   Lab Results:  Recent Labs    07/01/23 2017 07/02/23 0612  WBC 12.3* 14.1*  HGB 13.3 12.8*  HCT 40.4 39.3  PLT 194 184   BMET Recent Labs    07/01/23 2017 07/02/23 0612  NA  --  138  K  --  4.3  CL  --  105  CO2  --  25  GLUCOSE  --  124*  BUN  --  15  CREATININE 1.32* 1.19  CALCIUM  --  8.5*   PT/INR No results for input(s): "LABPROT", "INR" in the last 72 hours. ABG No results for input(s): "PHART", "HCO3" in the last 72 hours.  Invalid input(s): "PCO2", "PO2"  Studies/Results: No results found.  Anti-infectives: Anti-infectives (From admission, onward)    Start     Dose/Rate Route Frequency Ordered Stop   07/02/23 0700  acyclovir  (ZOVIRAX ) tablet 400 mg        400 mg Oral Every morning 07/01/23 1959     07/01/23 2200  ceFAZolin  (ANCEF ) IVPB 2g/100 mL premix        2 g 200 mL/hr over 30 Minutes Intravenous Every 8 hours 07/01/23 1923 07/01/23 2118   07/01/23 1045  ceFAZolin  (ANCEF ) IVPB 2g/100 mL premix        2 g 200 mL/hr over 30 Minutes Intravenous On call to O.R. 07/01/23 1039 07/01/23 1249       Assessment/Plan: s/p Procedure(s) with comments: ROBOTIC PROXIMAL SMALL BOWEL RESECTION WITH MESENTERIC MASS RESECTION (N/A) -  ROBOTIC SMALL BOWEL RESECTION WITH MESENTERIC MASS RESECTION Await path Advance diet to soft.  Incentive spirometry.  D/c foley Inhalers for asthma Home meds for htn Protonix  for gerd Flomax for urinary retention  Home tomorrow if has a BM.  Lovenox for vte ppx.    LOS: 1 day    Lockie Rima 07/02/2023

## 2023-07-02 NOTE — Progress Notes (Signed)
 Mobility Specialist Progress Note:    07/02/23 1516  Mobility  Activity Ambulated with assistance in hallway  Level of Assistance Standby assist, set-up cues, supervision of patient - no hands on  Assistive Device None  Distance Ambulated (ft) 550 ft  Activity Response Tolerated well  Mobility Referral Yes  Mobility visit 1 Mobility  Mobility Specialist Start Time (ACUTE ONLY) 1445  Mobility Specialist Stop Time (ACUTE ONLY) 1500  Mobility Specialist Time Calculation (min) (ACUTE ONLY) 15 min   Received pt in bed having no complaints and agreeable to mobility. Pt was asymptomatic throughout ambulation, stated he felt much better this walk than this morning. Returned to room w/o fault. Left in bed w/ call bell in reach and all needs met.   Inetta Manes Mobility Specialist  Please contact vis Secure Chat or  Rehab Office [phone]

## 2023-07-02 NOTE — TOC Initial Note (Signed)
 Transition of Care [facility]) - Initial/Assessment Note    Patient Details  Name: Shawn Owens MRN: [id number] Date of Birth: December 26, 1965  [facility]) CM/SW Contact:    Jonathan Neighbor, RN Phone Number: 07/02/2023, 2:20 PM  Clinical Narrative:                  Pt is from home with wife and son. Pt states his son can be at home with him. Son is special needs but able to assist pt if needed.  Cane/ walker/ wheelchair at home. Spouse can provide needed transportation.  Pt manages his own medications and denies any issues.  TOC following.  Expected Discharge Plan: Home/Self Care Barriers to Discharge: Continued Medical Work up   Patient Goals and CMS Choice            Expected Discharge Plan and Services       Living arrangements for the past 2 months: Single Family Home                                      Prior Living Arrangements/Services Living arrangements for the past 2 months: Single Family Home Lives with:: Spouse, Adult Children Patient language and need for interpreter reviewed:: Yes Do you feel safe going back to the place where you live?: Yes        Care giver support system in place?: Yes (comment)   Criminal Activity/Legal Involvement Pertinent to Current Situation/Hospitalization: No - Comment as needed  Activities of Daily Living   ADL Screening (condition at time of admission) Independently performs ADLs?: Yes (appropriate for developmental age) Is the patient deaf or have difficulty hearing?: No Does the patient have difficulty seeing, even when wearing glasses/contacts?: Yes Does the patient have difficulty concentrating, remembering, or making decisions?: No  Permission Sought/Granted                  Emotional Assessment Appearance:: Appears stated age Attitude/Demeanor/Rapport: Engaged Affect (typically observed): Accepting Orientation: : Oriented to Self, Oriented to Place, Oriented to  Time, Oriented to  Situation   Psych Involvement: No (comment)  Admission diagnosis:  [place] of ileum [K63.89] Mesenteric mass [K63.89] Patient Active Problem List   Diagnosis Date Noted   Mesenteric mass 07/01/2023   Postoperative state 12/01/2022   S/P complete repair of rotator cuff 11/29/2022   Rotator cuff disorder, right 11/29/2022   PCP:  Aloha Arnold, PA-C Pharmacy:   [facility] [address] [place], [place] - [address] [address] [place] [address] Phone: [phone] Fax: [phone]     Social Drivers of Health (SDOH) Social History: SDOH Screenings   Food Insecurity: No Food Insecurity (07/01/2023)  Housing: Unknown (07/01/2023)  Transportation Needs: No Transportation Needs (07/01/2023)  Utilities: Not At Risk (07/01/2023)  Social Connections: Moderately Isolated (07/01/2023)  Tobacco Use: Low Risk  (07/01/2023)  Recent Concern: Tobacco Use - Medium Risk (04/03/2023)   Received from [facility]   SDOH Interventions:     Readmission Risk Interventions     No data to display

## 2023-07-02 NOTE — Progress Notes (Signed)
 Mobility Specialist Progress Note:    07/02/23 1243  Mobility  Activity Ambulated with assistance in hallway  Level of Assistance Standby assist, set-up cues, supervision of patient - no hands on  Assistive Device None  Distance Ambulated (ft) 250 ft  Activity Response Tolerated well  Mobility Referral Yes  Mobility visit 1 Mobility  Mobility Specialist Start Time (ACUTE ONLY) 1022  Mobility Specialist Stop Time (ACUTE ONLY) 1035  Mobility Specialist Time Calculation (min) (ACUTE ONLY) 13 min   Received pt in bed having no complaints and agreeable to mobility. Pt c/o mild abdominal pain throughout ambulation, otherwise no c/o. Returned to room w/o fault. Left in bed w/ call bell in reach and all needs met.   Inetta Manes Mobility Specialist  Please contact vis Secure Chat or  Rehab Office [phone]

## 2023-07-03 LAB — BASIC METABOLIC PANEL WITH GFR
Anion gap: 7 (ref 5–15)
BUN: 14 mg/dL (ref 6–20)
CO2: 26 mmol/L (ref 22–32)
Calcium: 8.6 mg/dL — ABNORMAL LOW (ref 8.9–10.3)
Chloride: 108 mmol/L (ref 98–111)
Creatinine, Ser: 1.08 mg/dL (ref 0.61–1.24)
GFR, Estimated: >60 mL/min (ref >60)
Glucose, Bld: 98 mg/dL (ref 70–99)
Potassium: 4 mmol/L (ref 3.5–5.1)
Sodium: 141 mmol/L (ref 135–145)

## 2023-07-03 LAB — CBC
HCT: 42.2 % (ref 39.0–52.0)
Hemoglobin: 13.6 g/dL (ref 13.0–17.0)
MCH: 28.5 pg (ref 26.0–34.0)
MCHC: 32.2 g/dL (ref 30.0–36.0)
MCV: 88.3 fL (ref 80.0–100.0)
Platelets: 167 10*3/uL (ref 150–400)
RBC: 4.78 MIL/uL (ref 4.22–5.81)
RDW: 13.9 % (ref 11.5–15.5)
WBC: 9.2 10*3/uL (ref 4.0–10.5)
nRBC: 0 % (ref 0.0–0.2)

## 2023-07-03 MED ORDER — KCL-LACTATED RINGERS-D5W 20 MEQ/L IV SOLN
INTRAVENOUS | Status: AC
  Filled 2023-07-03 (×3): qty 1000

## 2023-07-03 MED ORDER — LACTATED RINGERS IV BOLUS
1000.0000 mL | Freq: Once | INTRAVENOUS | Status: AC
  Administered 2023-07-03: 1000 mL via INTRAVENOUS

## 2023-07-03 MED ORDER — ONDANSETRON 4 MG PO TBDP: 4.0000 mg | ORAL_TABLET | Freq: Four times a day (QID) | ORAL | 0 refills | Status: DC | PRN

## 2023-07-03 MED ORDER — OXYCODONE HCL 5 MG PO TABS: 5.0000 mg | ORAL_TABLET | ORAL | 0 refills | Status: DC | PRN

## 2023-07-03 MED ORDER — PANTOPRAZOLE SODIUM 40 MG PO TBEC
40.0000 mg | DELAYED_RELEASE_TABLET | Freq: Every day | ORAL | Status: DC
  Administered 2023-07-03 – 2023-07-04 (×2): 40 mg via ORAL
  Filled 2023-07-03 (×2): qty 1

## 2023-07-03 MED ORDER — LOPERAMIDE HCL 2 MG PO CAPS
2.0000 mg | ORAL_CAPSULE | Freq: Three times a day (TID) | ORAL | Status: AC
  Administered 2023-07-03 – 2023-07-04 (×6): 2 mg via ORAL
  Filled 2023-07-03 (×6): qty 1

## 2023-07-03 MED ORDER — GABAPENTIN 300 MG PO CAPS
600.0000 mg | ORAL_CAPSULE | Freq: Two times a day (BID) | ORAL | Status: DC
  Administered 2023-07-03 – 2023-07-05 (×4): 600 mg via ORAL
  Filled 2023-07-03 (×4): qty 2

## 2023-07-03 NOTE — Progress Notes (Signed)
 Mobility Specialist Progress Note:    07/03/23 1456  Mobility  Activity Ambulated with assistance in hallway  Level of Assistance Contact guard assist, steadying assist  Assistive Device None  Distance Ambulated (ft) 550 ft  Activity Response Tolerated well  Mobility Referral Yes  Mobility visit 1 Mobility  Mobility Specialist Start Time (ACUTE ONLY) 1440  Mobility Specialist Stop Time (ACUTE ONLY) 1455  Mobility Specialist Time Calculation (min) (ACUTE ONLY) 15 min   Received pt in bed having no complaints, feeling much better this afternoon, agreeable to mobility. Pt was asymptomatic throughout ambulation, no c/o nausea this session. Returned to room w/o fault. Left in bed w/ call bell in reach and all needs met.   Inetta Manes Mobility Specialist  Please contact vis Secure Chat or  Rehab Office [phone]

## 2023-07-03 NOTE — Discharge Summary (Signed)
 Physician Discharge Summary  Patient ID: Shawn Owens MRN: [id number] DOB/AGE: 57-Oct-1967 57 y.o.  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  He was admitted to the floor.  On postop day 1 he had adequate pain control with oral pain medication and was able to tolerate liquids.  He was anxious to go home.  However, he had not had a bowel movement.  By the next morning, he had had 15 bowel movements but these were diarrhea and he was feeling quite weak.  He was given IV fluid bolus and placed on IV fluids as well as some Imodium .  By the next morning he was doing better, but had had several dark stools and still felt a bit weak.  He was kept an additional day to get stronger and to make sure that these black stools did not represent anastomotic bleeding.  He was discharged on postop day ***stable condition.  Consults: None  Significant Diagnostic Studies: labs: Hematocrit ***date of discharge  Treatments: surgery: See above  Discharge Exam: Blood pressure (!) 144/96, pulse 76, temperature (!) 97.5 F (36.4 C), temperature source Oral, resp. rate 19, height 5\' 10"  (1.778 m), weight 101.6 kg, SpO2 92%. General appearance: alert and no distress Respiratory: No distress Abdomen: Protuberant, bruising around extraction site, soft, nontender Extremities, no edema  Disposition:    Allergies as of 07/04/2023   No Known Allergies      Medication List     TAKE these medications    acyclovir  400 MG tablet Commonly known as: ZOVIRAX  Take 400 mg by mouth in the morning.    albuterol  108 (90 Base) MCG/ACT inhaler Commonly known as: VENTOLIN  HFA Inhale 2 puffs into the lungs every 6 (six) hours as needed for wheezing or shortness of breath.   ALPRAZolam  0.5 MG tablet Commonly known as: XANAX  Take 0.5 mg by mouth daily as needed for anxiety.   diclofenac  75 MG EC tablet Commonly known as: VOLTAREN  Take 75 mg by mouth 2 (two) times daily.   DULoxetine  30 MG capsule Commonly known as: CYMBALTA  Take 30 mg by mouth in the morning.   famotidine  20 MG tablet Commonly known as: PEPCID  Take 20 mg by mouth in the morning.   fiber supplement (BANATROL TF) liquid Take 60 mLs by mouth 2 (two) times daily.   gabapentin  600 MG tablet Commonly known as: NEURONTIN  Take 600 mg by mouth 2 (two) times daily.   loperamide  2 MG capsule Commonly known as: IMODIUM  Take 1 capsule (2 mg total) by mouth 3 (three) times daily as needed for diarrhea or loose stools.   losartan  50 MG tablet Commonly known as: COZAAR  Take 50 mg by mouth in the morning.   meloxicam  15 MG tablet Commonly known as: MOBIC  Take 15 mg by mouth daily.   multivitamin with minerals Tabs tablet Take 1 tablet by mouth daily. Start taking on: Jul 05, 2023   omeprazole 20 MG capsule Commonly known as: PRILOSEC Take 20 mg by mouth in the morning.   ondansetron  4 MG disintegrating tablet Commonly known as: ZOFRAN -ODT Take 1 tablet (4 mg total) by  mouth every 6 (six) hours as needed for nausea.   ondansetron  4 MG tablet Commonly known as: Zofran  Take 1 tablet (4 mg total) by mouth every 8 (eight) hours as needed for vomiting, refractory nausea / vomiting or nausea.   oxyCODONE  5 MG immediate release tablet Commonly known as: Oxy IR/ROXICODONE  Take 1-2 tablets (5-10 mg total) by mouth every 4 (four) hours as needed for moderate pain (pain score 4-6).   Symbicort 160-4.5 MCG/ACT inhaler Generic drug: budesonide -formoterol  Inhale 2 puffs into the lungs 2 (two) times daily.   tamsulosin   0.4 MG Caps capsule Commonly known as: FLOMAX  Take 0.4 mg by mouth in the morning.        Follow-up Information     Lockie Rima, MD Follow up on 08/04/2023.   Specialty: General Surgery Why: arrive 4 pm for 4:15 appt. Contact information: [address] [place] [address] [phone]                 Signed: Lockie Rima 07/04/2023, 1:58 PM

## 2023-07-03 NOTE — Progress Notes (Addendum)
 Mobility Specialist Progress Note:    07/03/23 1200  Mobility  Activity Ambulated with assistance in hallway  Level of Assistance Contact guard assist, steadying assist  Assistive Device Other (Comment) (Hand rail)  Distance Ambulated (ft) 250 ft  Activity Response Tolerated well  Mobility Referral Yes  Mobility visit 1 Mobility  Mobility Specialist Start Time (ACUTE ONLY) 1120  Mobility Specialist Stop Time (ACUTE ONLY) 1133  Mobility Specialist Time Calculation (min) (ACUTE ONLY) 13 min   Pt received in bed eager for mobility. Pt c/o strong abdominal pain but willing to ambulate. Pt was able to ambulate half way before needing a seated rest break d/t pain and nausea. Pt was able to ambulate back to room after seated rest break w/o fault. Left in bed w/ call bell and personal belongings in reach. All needs met. RN aware.  Inetta Manes Mobility Specialist  Please contact vis Secure Chat or  Rehab Office [phone]

## 2023-07-03 NOTE — Discharge Instructions (Signed)
 [facility]      [facility], [place] [phone]  ABDOMINAL SURGERY: POST OP INSTRUCTIONS  Always review your discharge instruction sheet given to you by the facility where your surgery was performed.  IF YOU HAVE DISABILITY OR FAMILY LEAVE FORMS, YOU MUST BRING THEM TO THE OFFICE FOR PROCESSING.  PLEASE DO NOT GIVE THEM TO YOUR DOCTOR.  A prescription for pain medication may be given to you upon discharge.  Take your pain medication as prescribed, if needed.  If narcotic pain medicine is not needed, then you may take acetaminophen  (Tylenol ) or ibuprofen  (Advil ) as needed. Take your usually prescribed medications unless otherwise directed. If you need a refill on your pain medication, please contact your pharmacy. They will contact our office to request authorization.  Prescriptions will not be filled after 5pm or on week-ends. You should follow a light diet the first few days after arrival home, such as soup and crackers, pudding, etc.unless your doctor has advised otherwise. A high-fiber, low fat diet can be resumed as tolerated.   Be sure to include lots of fluids daily. Most patients will experience some swelling and bruising on the chest and neck area.  Ice packs will help.  Swelling and bruising can take several days to resolve Most patients will experience some swelling and bruising in the area of the incision. Ice pack will help. Swelling and bruising can take several days to resolve..  It is common to experience some constipation if taking pain medication after surgery.  Increasing fluid intake and taking a stool softener will usually help or prevent this problem from occurring.  A mild laxative (Milk of Magnesia or Miralax) should be taken according to package directions if there are no bowel movements after 48 hours.  You may have steri-strips (small skin tapes) in place directly over the incision.  These strips should be left on the skin for 10-14 days.  If your surgeon used skin glue on  the incision, you may shower in 48 hours.  The glue will flake off over the next 2-3 weeks.  Any sutures or staples will be removed at the office during your follow-up visit. You may find that a light gauze bandage over your incision may keep your staples from being rubbed or pulled. You may shower and replace the bandage daily. ACTIVITIES:  You may resume regular (light) daily activities beginning the next day--such as daily self-care, walking, climbing stairs--gradually increasing activities as tolerated.  You may have sexual intercourse when it is comfortable.  Refrain from any heavy lifting or straining until approved by your doctor. You may drive when you no longer are taking prescription pain medication, you can comfortably wear a seatbelt, and you can safely maneuver your car and apply brakes Return to Work: __________4 weeks if applicable_________________________ Shawn Owens should see your doctor in the office for a follow-up appointment approximately two weeks after your surgery.  Make sure that you call for this appointment within a day or two after you arrive home to insure a convenient appointment time. OTHER INSTRUCTIONS:  _____________________________________________________________ _____________________________________________________________  WHEN TO CALL YOUR DOCTOR: Fever over 101.0 Inability to urinate Nausea and/or vomiting Extreme swelling or bruising Continued bleeding from incision. Increased pain, redness, or drainage from the incision. Difficulty swallowing or breathing Muscle cramping or spasms. Numbness or tingling in hands or feet or around lips.  The clinic staff is available to answer your questions during regular business hours.  Please don't hesitate to call and ask to speak to  one of the nurses if you have concerns.  For further questions, please visit [email or web address]

## 2023-07-03 NOTE — Plan of Care (Signed)

## 2023-07-03 NOTE — Progress Notes (Signed)
 2 Days Post-Op   Subjective/Chief Complaint: Patient feeling very dizzy and having diarrhea.  Has had about 15 watery stools this AM.    Objective: Vital signs in last 24 hours: Temp:  [97.5 F (36.4 C)-98.6 F (37 C)] 97.8 F (36.6 C) (05/08 1025) Pulse Rate:  [64-88] 83 (05/08 1027) Resp:  [17-20] 18 (05/08 1025) BP: (137-152)/(84-102) 137/100 (05/08 1027) SpO2:  [94 %-100 %] 94 % (05/08 1025) Weight:  [101.6 kg] 101.6 kg (05/07 1143)    Intake/Output from previous day: 05/07 0701 - 05/08 0700 In: 1100 [P.O.:1100] Out: 1050 [Urine:1050] Intake/Output this shift: No intake/output data recorded.  Alert and oriented Looks weak Breathing comfortably Abd protuberant, but at baseline.    Lab Results:  Recent Labs    07/02/23 0612 07/03/23 0618  WBC 14.1* 9.2  HGB 12.8* 13.6  HCT 39.3 42.2  PLT 184 167   BMET Recent Labs    07/02/23 0612 07/03/23 0618  NA 138 141  K 4.3 4.0  CL 105 108  CO2 25 26  GLUCOSE 124* 98  BUN 15 14  CREATININE 1.19 1.08  CALCIUM 8.5* 8.6*   PT/INR No results for input(s): "LABPROT", "INR" in the last 72 hours. ABG No results for input(s): "PHART", "HCO3" in the last 72 hours.  Invalid input(s): "PCO2", "PO2"  Studies/Results: No results found.  Anti-infectives: Anti-infectives (From admission, onward)    Start     Dose/Rate Route Frequency Ordered Stop   07/02/23 0700  acyclovir  (ZOVIRAX ) tablet 400 mg        400 mg Oral Every morning 07/01/23 1959     07/01/23 2200  ceFAZolin  (ANCEF ) IVPB 2g/100 mL premix        2 g 200 mL/hr over 30 Minutes Intravenous Every 8 hours 07/01/23 1923 07/01/23 2118   07/01/23 1045  ceFAZolin  (ANCEF ) IVPB 2g/100 mL premix        2 g 200 mL/hr over 30 Minutes Intravenous On call to O.R. 07/01/23 1039 07/01/23 1249       Assessment/Plan: s/p Procedure(s) with comments: ROBOTIC PROXIMAL SMALL BOWEL RESECTION WITH MESENTERIC MASS RESECTION (N/A) - ROBOTIC SMALL BOWEL RESECTION WITH  MESENTERIC MASS RESECTION Await path Soft diet as tolerated Lr bolus Add standing IV fluids after bolus finished Add imodium for 48 hours  Incentive spirometry.  D/c foley Inhalers for asthma Home meds for htn Protonix  for gerd Flomax for urinary retention Change gabapentin  back to BID  Lovenox for vte ppx.    LOS: 2 days    Lockie Rima 07/03/2023

## 2023-07-04 LAB — CBC
HCT: 42.7 % (ref 39.0–52.0)
Hemoglobin: 13.7 g/dL (ref 13.0–17.0)
MCH: 28.4 pg (ref 26.0–34.0)
MCHC: 32.1 g/dL (ref 30.0–36.0)
MCV: 88.4 fL (ref 80.0–100.0)
Platelets: 179 10*3/uL (ref 150–400)
RBC: 4.83 MIL/uL (ref 4.22–5.81)
RDW: 13.7 % (ref 11.5–15.5)
WBC: 8.2 10*3/uL (ref 4.0–10.5)
nRBC: 0 % (ref 0.0–0.2)

## 2023-07-04 LAB — BASIC METABOLIC PANEL WITH GFR
Anion gap: 8 (ref 5–15)
BUN: 14 mg/dL (ref 6–20)
CO2: 29 mmol/L (ref 22–32)
Calcium: 8.8 mg/dL — ABNORMAL LOW (ref 8.9–10.3)
Chloride: 104 mmol/L (ref 98–111)
Creatinine, Ser: 1.19 mg/dL (ref 0.61–1.24)
GFR, Estimated: >60 mL/min (ref >60)
Glucose, Bld: 110 mg/dL — ABNORMAL HIGH (ref 70–99)
Potassium: 4 mmol/L (ref 3.5–5.1)
Sodium: 141 mmol/L (ref 135–145)

## 2023-07-04 MED ORDER — BANATROL TF EN LIQD
60.0000 mL | Freq: Two times a day (BID) | ENTERAL | Status: DC
  Administered 2023-07-04 – 2023-07-05 (×2): 60 mL via ORAL
  Filled 2023-07-04 (×3): qty 60

## 2023-07-04 MED ORDER — LOPERAMIDE HCL 2 MG PO CAPS: 2.0000 mg | ORAL_CAPSULE | Freq: Three times a day (TID) | ORAL | 0 refills | Status: DC | PRN

## 2023-07-04 MED ORDER — ADULT MULTIVITAMIN W/MINERALS CH
1.0000 | ORAL_TABLET | Freq: Every day | ORAL | Status: DC
  Administered 2023-07-04 – 2023-07-05 (×2): 1 via ORAL
  Filled 2023-07-04 (×2): qty 1

## 2023-07-04 MED ORDER — ADULT MULTIVITAMIN W/MINERALS CH: 1.0000 | ORAL_TABLET | Freq: Every day | ORAL | 2 refills | Status: AC

## 2023-07-04 MED ORDER — BANATROL TF EN LIQD: 60.0000 mL | Freq: Two times a day (BID) | ENTERAL | 1 refills | Status: DC

## 2023-07-04 MED ORDER — ENSURE MAX PROTEIN PO LIQD
11.0000 [oz_av] | Freq: Every day | ORAL | Status: DC
  Administered 2023-07-04 – 2023-07-05 (×2): 11 [oz_av] via ORAL
  Filled 2023-07-04 (×2): qty 330

## 2023-07-04 NOTE — Progress Notes (Signed)
 3 Days Post-Op   Subjective/Chief Complaint: Dizziness resolved.  Still feeling quite weak.  Stools still loose, but less watery.  Drinking some Powerade.  Tolerating diet.  Feels slightly bloated.  Feels like after he eats or drinks, he has some abdominal growling followed by immediate bowel movement.  Complains of a lot of soreness at the extraction site  He said he did have few dark black stools.  Objective: Vital signs in last 24 hours: Temp:  [97.5 F (36.4 C)-97.9 F (36.6 C)] 97.5 F (36.4 C) (05/09 0822) Pulse Rate:  [76-90] 76 (05/09 0822) Resp:  [16-19] 19 (05/09 0822) BP: (131-144)/(91-96) 144/96 (05/09 0822) SpO2:  [92 %-99 %] 92 % (05/09 0822) Last BM Date : 07/03/23  Intake/Output from previous day: 05/08 0701 - 05/09 0700 In: 1214.8 [I.V.:214.6; IV Piggyback:1000.1] Out: 550 [Urine:550] Intake/Output this shift: Total I/O In: 240 [P.O.:240] Out: 1100 [Urine:1100]  Alert and oriented Looking stronger Breathing comfortably Abd protuberant, but here baseline.  Has bruising at the extraction site   Lab Results:  Recent Labs    07/03/23 0618 07/04/23 0632  WBC 9.2 8.2  HGB 13.6 13.7  HCT 42.2 42.7  PLT 167 179   BMET Recent Labs    07/03/23 0618 07/04/23 0632  NA 141 141  K 4.0 4.0  CL 108 104  CO2 26 29  GLUCOSE 98 110*  BUN 14 14  CREATININE 1.08 1.19  CALCIUM 8.6* 8.8*   PT/INR No results for input(s): "LABPROT", "INR" in the last 72 hours. ABG No results for input(s): "PHART", "HCO3" in the last 72 hours.  Invalid input(s): "PCO2", "PO2"  Studies/Results: No results found.  Anti-infectives: Anti-infectives (From admission, onward)    Start     Dose/Rate Route Frequency Ordered Stop   07/02/23 0700  acyclovir  (ZOVIRAX ) tablet 400 mg        400 mg Oral Every morning 07/01/23 1959     07/01/23 2200  ceFAZolin  (ANCEF ) IVPB 2g/100 mL premix        2 g 200 mL/hr over 30 Minutes Intravenous Every 8 hours 07/01/23 1923 07/01/23 2118    07/01/23 1045  ceFAZolin  (ANCEF ) IVPB 2g/100 mL premix        2 g 200 mL/hr over 30 Minutes Intravenous On call to O.R. 07/01/23 1039 07/01/23 1249       Assessment/Plan: s/p Procedure(s) with comments: ROBOTIC PROXIMAL SMALL BOWEL RESECTION WITH MESENTERIC MASS RESECTION (N/A) - ROBOTIC SMALL BOWEL RESECTION WITH MESENTERIC MASS RESECTION Await path  Soft diet as tolerated Saline lock IV fluids Finish out 48 hours of Imodium   Incentive spirometry.   Recheck hematocrit tomorrow morning given the presence of several black stools  Inhalers for asthma Home meds for htn Protonix  for gerd Flomax  for urinary retention Change gabapentin  back to BID  Lovenox  for vte ppx.   Dispo: Anticipate DC home tomorrow as long as hematocrit stays the same and patient's diarrhea is improved.     LOS: 3 days    Lockie Rima 07/04/2023

## 2023-07-04 NOTE — Progress Notes (Signed)
 Initial Nutrition Assessment  DOCUMENTATION CODES:   Obesity unspecified  INTERVENTION:   Ensure Max po BID, each supplement provides 150 kcal and 30 grams of protein.  Banatrol 60 ml BID for loose stools  Small frequent meals add 10 am and 2 PM snacks  Multivitamin with minerals daily   Separation of beverages from meals Avoid foods/beverages high in sugar   NUTRITION DIAGNOSIS:   Increased nutrient needs related to post-op healing as evidenced by estimated needs.  GOAL:   Patient will meet greater than or equal to 90% of their needs   MONITOR:   PO intake, Supplement acceptance, Diet advancement, I & O's  REASON FOR ASSESSMENT:   Consult Assessment of nutrition requirement/status  ASSESSMENT:  58 y.o Male who presented for surgery, with diagnosis of mesenteric mass. PMH of HTN, GERD, arthritis.  5/6 - Robotic resection of mesenteric mass with proximal small bowel resection    Pt started having abdominal discomfort approximately 6-8 months ago. Found to have large kidney stone on right side and multiple stones on the left side. Pt still with pain S/P stent placement and removal. Had removal of mesenteric mass and small bowel resection during this admission. Post-op patient has been experiencing multiple watery stools, noted to be 15 yesterday morning. MD started Imodium  in which patient states has helped a little but is still having loose BM. He states his stomach "grumbles" shortly after anything he eats or drinks including water  and then has a BM shortly after. Reports stools have turned black.   Patient still with great appetite despite loose BM's. 100% meals documented. Did only have eggs and 2 pieces of bacon for breakfast. Powerade, Gatorade, and  jolly ranchers noted at bedside. Encouraged small frequent meals to help loose stools. Pt agreeable to snacks. Discouraged consumption of added sugars that may lead to diarrhea.   Patient may be experiencing short gut  syndrome, if diarrhea continues may lead to malabsorption, and malnutrition. Discussed techniques to increase absorption with patient. Pt agreeable to ONS. Will trial Ensure Max due to lower osmolarity and pt having good appetite.   PTA Pt reports having a great appetite and PO intake. He intentionally lost around 20 lbs within the past 3 months to help lower his A1C. He stopped drinking soda and eating desserts during this time. Previously he states he was drinking about a case of cola a day. Is lactose intolerant but reports he can have dairy before 2PM and it does not affect him. Avoids heavily seasoned foods. Some nausea today was dizzy yesterday but not anymore.   Admit weight: 101.6 kg  Current weight: 101.6 kg    Average Meal Intake: 5/7-5/9: 100% intake x 3 recorded meals  Nutritionally Relevant Medications: Scheduled Meds:  acetaminophen   1,000 mg Oral Q6H   acyclovir   400 mg Oral q AM   Chlorhexidine  Gluconate Cloth  6 each Topical Once   DULoxetine   30 mg Oral q AM   enoxaparin  (LOVENOX ) injection  40 mg Subcutaneous Q24H   fiber supplement (BANATROL TF)  60 mL Oral BID   fluticasone  furoate-vilanterol  1 puff Inhalation Daily   gabapentin   600 mg Oral BID   loperamide   2 mg Oral TID   losartan   50 mg Oral q AM   multivitamin with minerals  1 tablet Oral Daily   pantoprazole   40 mg Oral QHS   Ensure Max Protein  11 oz Oral Daily   tamsulosin   0.4 mg Oral q AM  Labs Reviewed: Calcium 8.8,  No recent CBG  No A1C   NUTRITION - FOCUSED PHYSICAL EXAM:  Flowsheet Row Most Recent Value  Orbital Region No depletion  Upper Arm Region No depletion  Buccal Region No depletion  Temple Region No depletion  Clavicle Bone Region No depletion  Clavicle and Acromion Bone Region No depletion  Scapular Bone Region No depletion  Dorsal Hand No depletion  Patellar Region No depletion  Anterior Thigh Region No depletion  Posterior Calf Region No depletion  Edema (RD Assessment)  None  Hair Reviewed  Eyes Reviewed  Mouth Reviewed  Skin Reviewed  Nails Reviewed       Diet Order:   Diet Order             DIET SOFT Room service appropriate? Yes; Fluid consistency: Thin  Diet effective now                   EDUCATION NEEDS:   Education needs have been addressed  Skin:  Skin Assessment: Skin Integrity Issues: Skin Integrity Issues:: Incisions Incisions: Abdomen  Last BM:  5/9 - Type 7, having multi BM's  Height:   Ht Readings from Last 1 Encounters:  07/02/23 5\' 10"  (1.778 m)    Weight:   Wt Readings from Last 1 Encounters:  07/02/23 101.6 kg    Ideal Body Weight:  75.5 kg  BMI:  Body mass index is 32.14 kg/m.  Estimated Nutritional Needs:   Kcal:  2400-2600 kcal  Protein:  120-140 gm  Fluid:  >2L/day   Frederik Jansky, RD Registered Dietitian  See Amion for more information

## 2023-07-04 NOTE — Plan of Care (Signed)
  Problem: Clinical Measurements: Goal: Diagnostic test results will improve Outcome: Progressing   Problem: Clinical Measurements: Goal: Cardiovascular complication will be avoided Outcome: Progressing   Problem: Activity: Goal: Risk for activity intolerance will decrease Outcome: Progressing   Problem: Coping: Goal: Level of anxiety will decrease Outcome: Progressing   Problem: Elimination: Goal: Will not experience complications related to bowel motility Outcome: Progressing   Problem: Skin Integrity: Goal: Risk for impaired skin integrity will decrease Outcome: Progressing   Problem: Safety: Goal: Ability to remain free from injury will improve Outcome: Progressing

## 2023-07-04 NOTE — Progress Notes (Signed)
 Mobility Specialist Progress Note:   07/04/23 1211  Mobility  Activity Ambulated with assistance in hallway  Level of Assistance Standby assist, set-up cues, supervision of patient - no hands on  Assistive Device None  Distance Ambulated (ft) 550 ft  Activity Response Tolerated well  Mobility Referral Yes  Mobility visit 1 Mobility  Mobility Specialist Start Time (ACUTE ONLY) 1132  Mobility Specialist Stop Time (ACUTE ONLY) 1145  Mobility Specialist Time Calculation (min) (ACUTE ONLY) 13 min   Received pt in bed having no complaints and agreeable to mobility. Pt was asymptomatic throughout ambulation and returned to room w/o fault. Left in bed w/ call bell in reach and all needs met.   Inetta Manes Mobility Specialist  Please contact vis Secure Chat or  Rehab Office [phone]

## 2023-07-05 LAB — CBC
HCT: 42.4 % (ref 39.0–52.0)
Hemoglobin: 13.6 g/dL (ref 13.0–17.0)
MCH: 28.4 pg (ref 26.0–34.0)
MCHC: 32.1 g/dL (ref 30.0–36.0)
MCV: 88.5 fL (ref 80.0–100.0)
Platelets: 202 10*3/uL (ref 150–400)
RBC: 4.79 MIL/uL (ref 4.22–5.81)
RDW: 13.4 % (ref 11.5–15.5)
WBC: 8.9 10*3/uL (ref 4.0–10.5)
nRBC: 0 % (ref 0.0–0.2)

## 2023-07-05 MED ORDER — LOPERAMIDE HCL 2 MG PO CAPS: 2.0000 mg | ORAL_CAPSULE | Freq: Three times a day (TID) | ORAL | 0 refills | Status: AC | PRN

## 2023-07-05 NOTE — Progress Notes (Signed)
 Mobility Specialist Progress Note:    07/05/23 0951  Mobility  Activity Ambulated with assistance in hallway  Level of Assistance Standby assist, set-up cues, supervision of patient - no hands on  Assistive Device Front wheel walker  Distance Ambulated (ft) 400 ft  Activity Response Tolerated well  Mobility Referral Yes  Mobility visit 1 Mobility  Mobility Specialist Start Time (ACUTE ONLY) 0915  Mobility Specialist Stop Time (ACUTE ONLY) [id number]  Mobility Specialist Time Calculation (min) (ACUTE ONLY) 8 min   Received pt ambulating in room having no complaints and agreeable to mobility. Pt was asymptomatic throughout ambulation and returned to room w/o fault. Left ambulating in room w/ call bell in reach and all needs met.    D'Vante Nolon Baxter Mobility Specialist Please contact via Special educational needs teacher or Rehab office at [phone]

## 2023-07-05 NOTE — Progress Notes (Signed)
 4 Days Post-Op   Subjective/Chief Complaint: Feeling better today. Bowel movements are less loose and passing more flatus which seems to have helped pain. Bms are no longer black. No n/v and tolerating solid diet. Hgb/hct stable  Objective: Vital signs in last 24 hours: Temp:  [97.5 F (36.4 C)-97.8 F (36.6 C)] 97.7 F (36.5 C) (05/10 0419) Pulse Rate:  [73-95] 73 (05/10 0419) Resp:  [17-19] 17 (05/10 0419) BP: (125-144)/(79-96) 125/91 (05/10 0419) SpO2:  [92 %-98 %] 98 % (05/10 0419) Last BM Date : 07/03/23  Intake/Output from previous day: 05/09 0701 - 05/10 0700 In: 240 [P.O.:240] Out: 2300 [Urine:2300] Intake/Output this shift: Total I/O In: -  Out: 500 [Urine:500]  Alert and oriented NAD and comfortable appearing Breathing comfortably Abd protuberant, but baseline.  Has bruising at the extraction site. No erythema or induration   Lab Results:  Recent Labs    07/04/23 0632 07/05/23 0320  WBC 8.2 8.9  HGB 13.7 13.6  HCT 42.7 42.4  PLT 179 202   BMET Recent Labs    07/03/23 0618 07/04/23 0632  NA 141 141  K 4.0 4.0  CL 108 104  CO2 26 29  GLUCOSE 98 110*  BUN 14 14  CREATININE 1.08 1.19  CALCIUM 8.6* 8.8*   PT/INR No results for input(s): "LABPROT", "INR" in the last 72 hours. ABG No results for input(s): "PHART", "HCO3" in the last 72 hours.  Invalid input(s): "PCO2", "PO2"  Studies/Results: No results found.  Anti-infectives: Anti-infectives (From admission, onward)    Start     Dose/Rate Route Frequency Ordered Stop   07/02/23 0700  acyclovir  (ZOVIRAX ) tablet 400 mg        400 mg Oral Every morning 07/01/23 1959     07/01/23 2200  ceFAZolin  (ANCEF ) IVPB 2g/100 mL premix        2 g 200 mL/hr over 30 Minutes Intravenous Every 8 hours 07/01/23 1923 07/01/23 2118   07/01/23 1045  ceFAZolin  (ANCEF ) IVPB 2g/100 mL premix        2 g 200 mL/hr over 30 Minutes Intravenous On call to O.R. 07/01/23 1039 07/01/23 1249        Assessment/Plan: s/p Procedure(s) with comments: ROBOTIC PROXIMAL SMALL BOWEL RESECTION WITH MESENTERIC MASS RESECTION (N/A) - ROBOTIC SMALL BOWEL RESECTION WITH MESENTERIC MASS RESECTION Await path  Soft diet as tolerated Saline lock IV fluids Finish out 48 hours of Imodium   Incentive spirometry.   Hematocrit stable today and diarrhea improved. Encouraged ongoing good fluid intake  Inhalers for asthma Home meds for htn Protonix  for gerd Flomax  for urinary retention Change gabapentin  back to BID  Lovenox  for vte ppx.   Dispo: dc today   LOS: 4 days   Elwin Hammond, [facility] 07/05/2023, 9:36 AM Please see Amion for pager number during day hours 7:00am-4:30pm

## 2023-07-11 ENCOUNTER — Ambulatory Visit: Payer: Self-pay | Admitting: General Surgery

## 2023-07-15 LAB — SURGICAL PATHOLOGY

## 2023-12-22 ENCOUNTER — Other Ambulatory Visit: Payer: Self-pay

## 2023-12-22 ENCOUNTER — Emergency Department (HOSPITAL_COMMUNITY)

## 2023-12-22 ENCOUNTER — Emergency Department (HOSPITAL_COMMUNITY)
Admission: EM | Admit: 2023-12-22 | Discharge: 2023-12-23 | Discharge status: Against Medical Advice | Attending: Emergency Medicine | Admitting: Emergency Medicine

## 2023-12-22 ENCOUNTER — Encounter (HOSPITAL_COMMUNITY): Payer: Self-pay | Admitting: Emergency Medicine

## 2023-12-22 DIAGNOSIS — M25552 Pain in left hip: Secondary | ICD-10-CM | POA: Insufficient documentation

## 2023-12-22 DIAGNOSIS — W1789XA Other fall from one level to another, initial encounter: Secondary | ICD-10-CM | POA: Diagnosis not present

## 2023-12-22 DIAGNOSIS — M545 Low back pain, unspecified: Secondary | ICD-10-CM | POA: Diagnosis not present

## 2023-12-22 DIAGNOSIS — Z5321 Procedure and treatment not carried out due to patient leaving prior to being seen by health care provider: Secondary | ICD-10-CM | POA: Diagnosis not present

## 2023-12-22 DIAGNOSIS — G8929 Other chronic pain: Secondary | ICD-10-CM | POA: Diagnosis not present

## 2023-12-22 NOTE — ED Provider Triage Note (Signed)
 Emergency Medicine Provider Triage Evaluation Note  Shawn Owens , a 58 y.o. male  was evaluated in triage.  Pt complains of hip pain. Progressive worsening pain to L hip ongoing x 2 months.  Had a fall 3 years ago from 45ft and injured his L leg.  Thought it may have cause reoccuring pain to L hip.  Endorse lower back pain as well. No numbness or weakness, no fever chills.  Heating pad doesn't help  Review of Systems  Positive: As above Negative: As above  Physical Exam  BP (!) 158/102   Pulse 96   Temp 97.7 F (36.5 C)   Resp 16   Wt 100.2 kg   SpO2 93%   BMI 31.71 kg/m  Gen:   Awake, no distress   Resp:  Normal effort  MSK:   Moves extremities without difficulty  Other:    Medical Decision Making  Medically screening exam initiated at 8:20 PM.  Appropriate orders placed.  Shawn Owens was informed that the remainder of the evaluation will be completed by another provider, this initial triage assessment does not replace that evaluation, and the importance of remaining in the ED until their evaluation is complete.     Nivia Colon, PA-C 12/22/23 2023

## 2023-12-22 NOTE — ED Triage Notes (Signed)
 Patient c/o left hip pain x 2 months.  Patient states he fell 15 feet 3 years ago and did damage to his foot and is concerned his hip is related to this.  Patient states the pain is so bad he can't sleep.

## 2023-12-22 NOTE — ED Triage Notes (Signed)
 Patient reports chronic left hip pain for 2 months , denies injury /ambulatory .

## 2023-12-23 NOTE — ED Notes (Signed)
lwbs

## 2024-03-03 NOTE — H&P (Signed)
 " Patient's anticipated LOS is less than 2 midnights, meeting these requirements: - Younger than 28 - Lives within 1 hour of care - Has a competent adult at home to recover with post-op recover - NO history of  - Chronic pain requiring opiods  - Diabetes  - Coronary Artery Disease  - Heart failure  - Heart attack  - Stroke  - DVT/VTE  - Cardiac arrhythmia  - Respiratory Failure/COPD  - Renal failure  - Anemia  - Advanced Liver disease     Shawn Owens is an 59 y.o. male.    Chief Complaint: left shoulder pain  HPI: Pt is a 59 y.o. male complaining of left shoulder pain for multiple years. Pain had continually increased since the beginning. X-rays in the clinic show rotator cuff left shoulder. Pt has tried various conservative treatments which have failed to alleviate their symptoms, including injections and therapy. Various options are discussed with the patient. Risks, benefits and expectations were discussed with the patient. Patient understand the risks, benefits and expectations and wishes to proceed with surgery.   PCP:  Montey Lot, PA-C  D/C Plans: Home  PMH: Past Medical History:  Diagnosis Date   Anxiety    Arthritis    knees and hips   Asthma    Closed fracture of calcaneus 06/03/2021   left   Complication of anesthesia    after shoulder surgery and having a block ended up having an asthma attack waking up after surgery.   Dyspnea    with activity   GERD (gastroesophageal reflux disease)    Hypertension    Pneumonia    hx of about 3 or 4 times in his lifetime, per pt    PSH: Past Surgical History:  Procedure Laterality Date   APPENDECTOMY  1977   HERNIA REPAIR     INSERTION OF MESH Left 03/20/2021   Procedure: INSERTION OF MESH;  Surgeon: Rubin Calamity, MD;  Location: [facility];  Service: General;  Laterality: Left;   LIPOMA EXCISION     Right shoulder   ORIF CALCANEOUS FRACTURE Left 06/14/2021   Procedure: OPEN REDUCTION INTERNAL FIXATION  (ORIF) CALCANEOUS FRACTURE;  Surgeon: Kit Rush, MD;  Location: [facility];  Service: Orthopedics;  Laterality: Left;   SHOULDER ARTHROSCOPY WITH SUBACROMIAL DECOMPRESSION Right 11/29/2022   Procedure: SHOULDER ARTHROSCOPY WITH SUBACROMIAL DECOMPRESSION;  Surgeon: Kay Kemps, MD;  Location: [facility]S;  Service: Orthopedics;  Laterality: Right;  interscalene block  120 need 1st assistant due to no PA   SHOULDER OPEN ROTATOR CUFF REPAIR Right 11/29/2022   Procedure: MINI OPEN ROTATOR CUFF REPAIR;  Surgeon: Kay Kemps, MD;  Location: [facility];  Service: Orthopedics;  Laterality: Right;  interscalene block  120 need 1st assistant due to no PA   UMBILICAL HERNIA REPAIR N/A 03/20/2021   Procedure: HERNIA REPAIR UMBILICAL ADULT;  Surgeon: Rubin Calamity, MD;  Location: [facility] OR;  Service: General;  Laterality: N/A;   XI ROBOTIC ASSISTED SMALL BOWEL RESECTION N/A 07/01/2023   Procedure: ROBOTIC PROXIMAL SMALL BOWEL RESECTION WITH MESENTERIC MASS RESECTION;  Surgeon: Aron Shoulders, MD;  Location: [facility] OR;  Service: General;  Laterality: N/A;  ROBOTIC SMALL BOWEL RESECTION WITH MESENTERIC MASS RESECTION    Social History:  reports that he has never smoked. He has never used smokeless tobacco. He reports that he does not drink alcohol and does not use drugs. BMI: Estimated body mass index is 31.71 kg/m as calculated from the following:   Height as of 07/02/23:  5' 10 (1.778 m).   Weight as of 12/22/23: 100.2 kg.  Lab Results  Component Value Date   ALBUMIN  3.6 06/27/2023   Diabetes: Patient does not have a diagnosis of diabetes.     Smoking Status:   reports that he has never smoked. He has never used smokeless tobacco.    Allergies:  Allergies[1]  Medications: No current facility-administered medications for this encounter.   Current Outpatient Medications  Medication Sig Dispense Refill   acyclovir  (ZOVIRAX ) 400 MG tablet Take 400 mg by mouth in the morning.      albuterol  (VENTOLIN  HFA) 108 (90 Base) MCG/ACT inhaler Inhale 2 puffs into the lungs every 6 (six) hours as needed for wheezing or shortness of breath.     ALPRAZolam  (XANAX ) 0.5 MG tablet Take 0.5 mg by mouth daily as needed for anxiety.     diclofenac  (VOLTAREN ) 75 MG EC tablet Take 75 mg by mouth 2 (two) times daily.     DULoxetine  (CYMBALTA ) 30 MG capsule Take 30 mg by mouth in the morning.     famotidine  (PEPCID ) 20 MG tablet Take 20 mg by mouth in the morning.     fiber supplement, BANATROL TF, liquid Take 60 mLs by mouth 2 (two) times daily. 3600 mL 1   gabapentin  (NEURONTIN ) 600 MG tablet Take 600 mg by mouth 2 (two) times daily.     losartan  (COZAAR ) 50 MG tablet Take 50 mg by mouth in the morning.     meloxicam  (MOBIC ) 15 MG tablet Take 15 mg by mouth daily.     Multiple Vitamin (MULTIVITAMIN WITH MINERALS) TABS tablet Take 1 tablet by mouth daily. 30 tablet 2   omeprazole (PRILOSEC) 20 MG capsule Take 20 mg by mouth in the morning.     ondansetron  (ZOFRAN -ODT) 4 MG disintegrating tablet Take 1 tablet (4 mg total) by mouth every 6 (six) hours as needed for nausea. 20 tablet 0   oxyCODONE  (OXY IR/ROXICODONE ) 5 MG immediate release tablet Take 1-2 tablets (5-10 mg total) by mouth every 4 (four) hours as needed for moderate pain (pain score 4-6). 30 tablet 0   SYMBICORT 160-4.5 MCG/ACT inhaler Inhale 2 puffs into the lungs 2 (two) times daily.     tamsulosin  (FLOMAX ) 0.4 MG CAPS capsule Take 0.4 mg by mouth in the morning.      No results found for this or any previous visit (from the past 48 hours). No results found.  ROS: Pain with rom of the left upper extremity  Physical Exam: Alert and oriented 59 y.o. male in no acute distress Cranial nerves 2-12 intact Cervical spine: full rom with no tenderness, nv intact distally Chest: active breath sounds bilaterally, no wheeze rhonchi or rales Heart: regular rate and rhythm, no murmur Abd: non tender non distended with active bowel  sounds Hip is stable with rom  Left shoulder painful rom with weakness Nv intact distally  Assessment/Plan Assessment: left shoulder rotator cuff tear  Plan:  Patient will undergo a left shoulder rotator cuff repair by Dr. Kay at [place] Risks benefits and expectations were discussed with the patient. Patient understand risks, benefits and expectations and wishes to proceed. Preoperative templating of the joint replacement has been completed, documented, and submitted to the Operating Room personnel in order to optimize intra-operative equipment management.   Arvella Fireman PA-C, MPAS [facility] is now [employer] [address], [address], [place], [place] [address] Phone: [phone] [email or web address]  [employer]/[email or web address]        [  1] No Known Allergies  "

## 2024-03-05 NOTE — Progress Notes (Signed)
 COVID Vaccine received:  []  No [x]  Yes Date of any COVID positive Test in last 90 days:  none  PCP - Rankin Dike, PA-C  Cardiologist - none Pain Mgmt: none per pt  Chest x-ray - 09-09-2015  2v EKG - 11-19-2022    11-12-2023 in [facility])  requested Stress Test -  ECHO -  Cardiac Cath -  CT Coronary Calcium score:   Pacemaker / ICD device [x]  No []  Yes   Spinal Cord Stimulator:[x]  No []  Yes       History of Sleep Apnea? []  No [x]  Yes   CPAP used?- []  No [x]  Yes    Medication on DOS: omeprazole, famotidine , Tamsulosin , Acyclovir ,   Hold DOS:  Losartan ,   Patient has: [x]  NO Hx DM   []  Pre-DM   []  DM1  []   DM2 Does the patient monitor blood sugar?   [x]  N/A   []  No []  Yes   Blood Thinner / Instructions:  none Aspirin  Instructions:  none  Activity level: Able to walk up 2 flights of stairs without becoming significantly short of breath or having chest pain?   [x]    Yes   []  No,  would have: back and leg pain No CP or SOB Patient can perform ADLs without assistance.  [x]   Yes    []  No   Anesthesia review: HTN, GERD, OSA- CPAP, anxiety, asthma, CPS  Patient was very anxious at PST appt always like that because I'm afraid of needles). First set of VS: BP 140/113, HR 106, and then I took them again at the end of his visit: BP 153/95, HR 101.  His last EKG [facility] on 11-22-2023) was read as NSR, Normal EKG.    I advised him to contact his PCP and see if they want to make any changes in his BP meds (only takes Valsartan). I told him that if his BP is too high on the day of surgery, his case may need to be postponed. He voiced understanding and will contact his PCP.    Patient denies any S&S of respiratory illness or Covid - no shortness of breath, fever, cough or chest pain at PAT appointment.  Patient verbalized understanding and agreement to the Pre-Surgical Instructions that were given to them at this PAT appointment. Patient was also educated of the need to review these  PAT instructions again prior to his surgery.I reviewed the appropriate phone numbers to call if they have any and questions or concerns.

## 2024-03-05 NOTE — Patient Instructions (Addendum)
 SURGICAL WAITING ROOM VISITATION Patients having surgery or a procedure may have no more than 2 support people in the waiting area - these visitors may rotate in the visitor waiting room.   If the patient needs to stay at the hospital during part of their recovery, the visitor guidelines for inpatient rooms apply.  PRE-OP VISITATION  Pre-op nurse will coordinate an appropriate time for 1 support person to accompany the patient in pre-op.  This support person may not rotate.  This visitor will be contacted when the time is appropriate for the visitor to come back in the pre-op area.  Temporary Visitor Restrictions   Children ages 75 and under will not be able to visit patients in [facility] under most circumstances. Visitation is not restricted outside of hospitals unless noted otherwise in the [facility] and Location Specific Visitation Guidelines at :       [email or web address]  Visitors with respiratory illnesses are discouraged from visiting and should remain at home.  You are not required to quarantine at this time prior to your surgery. However, you must do this: Hand Hygiene often Do NOT share personal items Notify your provider if you are in close contact with someone who has COVID or you develop fever 100.4 or greater, new onset of sneezing, cough, sore throat, shortness of breath or body aches.  If you test positive for Covid or have been in contact with anyone that has tested positive in the last 10 days please notify you surgeon.    Your procedure is scheduled on:  FRIDAY  03-12-24  Report to [facility] Main Entrance: [address] entrance where the [employer] is available.   Report to admitting at:  12:45 PM  Call this number if you have any questions or problems the morning of surgery [phone]  FOLLOW ANY ADDITIONAL PRE OP INSTRUCTIONS YOU RECEIVED FROM YOUR SURGEON'S OFFICE!!!  Do not eat food after Midnight the night prior to your  surgery/procedure.  After Midnight you may have the following liquids until   12:00 Noon DAY OF SURGERY  Clear Liquid Diet Water  Black Coffee (sugar ok, NO MILK/CREAM OR CREAMERS)  Tea (sugar ok, NO MILK/CREAM OR CREAMERS) regular and decaf                             Plain Jell-O  with no fruit (NO RED)                                           Fruit ices (not with fruit pulp, NO RED)                                     Popsicles (NO RED)                                                                  Juice: NO CITRUS JUICES: only apple, WHITE grape, WHITE cranberry Sports drinks like Gatorade or Powerade (NO RED)  The day of surgery:  Drink ONE (1) Pre-Surgery Clear Ensure at  12:00 noon the day of surgery. Drink in one sitting. Do not sip.  This drink was given to you during your hospital pre-op appointment visit. Nothing else to drink after completing the Pre-Surgery Clear Ensure : No candy, chewing gum or throat lozenges.     Oral Hygiene is also important to reduce your risk of infection.        Remember - BRUSH YOUR TEETH THE MORNING OF SURGERY WITH YOUR REGULAR TOOTHPASTE  Do NOT smoke after Midnight the night before surgery.  STOP TAKING all Vitamins, Herbs and supplements 1 week before your surgery.   Take ONLY these medicines the morning of surgery with A SIP OF WATER :  omeprazole, famotidine , Tamsulosin , Acyclovir ,    DO NOT TAKE Losartan , the day of your surgery.                   You may not have any metal on your body including  jewelry, and body piercing  Do not wear  lotions, powders, cologne, or deodorant  Men may shave face and neck.  Contacts, Hearing Aids, dentures or bridgework may not be worn into surgery. DENTURES WILL BE REMOVED PRIOR TO SURGERY PLEASE DO NOT APPLY Poly grip OR ADHESIVES!!!  Patients discharged on the day of surgery will not be allowed to drive home.  Someone NEEDS to stay with you for the first 24 hours after  anesthesia.  Do not bring your home medications to the hospital. The Pharmacy will dispense medications listed on your medication list to you during your admission in the Hospital.  Please read over the following fact sheets you were given: IF YOU HAVE QUESTIONS ABOUT YOUR PRE-OP INSTRUCTIONS, PLEASE CALL [phone]   [facility] - Preparing for Surgery           Before surgery, you can play an important role.  Because skin is not sterile, your skin needs to be as free of germs as possible.  You can reduce the number of germs on your skin by washing with CHG (chlorahexidine gluconate) soap before surgery.  CHG is an antiseptic cleaner which kills germs and bonds with the skin to continue killing germs even after washing. Please DO NOT use if you have an allergy to CHG or antibacterial soaps.  If your skin becomes reddened/irritated stop using the CHG and inform your nurse when you arrive at Short Stay. Do not shave (including legs and underarms) for at least 48 hours prior to the first CHG shower.  You may shave your face/neck.  Please follow these instructions carefully:  1.  Shower with CHG Soap the night before surgery ONLY (DO NOT USE THE CHG SOAP THE MORNING OF SURGERY).  2.  If you choose to wash your hair, wash your hair first as usual with your normal  shampoo.  3.  After you shampoo, rinse your hair and body thoroughly to remove the shampoo.                             4.  Use CHG as you would any other liquid soap.  You can apply chg directly to the skin and wash.  Gently with a scrungie or clean washcloth.  5.  Apply the CHG Soap to your body ONLY FROM THE NECK DOWN.   Do not use on face/ open  Wound or open sores. Avoid contact with eyes, ears mouth and genitals (private parts).                       Wash face,  Genitals (private parts) with your normal soap.             6.  Wash thoroughly, paying special attention to the area where your  surgery  will  be performed.  7.  Thoroughly rinse your body with warm water  from the neck down.  8.  DO NOT shower/wash with your normal soap after using and rinsing off the CHG Soap.                9.  Pat yourself dry with a clean towel.            10.  Wear clean pajamas.            11.  Place clean sheets on your bed the night of your first shower and do not  sleep with pets.  Day of Surgery : Do not apply any CHG, lotions/deodorants the morning of surgery.  Please wear clean clothes to the hospital/surgery center.   FAILURE TO FOLLOW THESE INSTRUCTIONS MAY RESULT IN THE CANCELLATION OF YOUR SURGERY  PATIENT SIGNATURE_________________________________  NURSE SIGNATURE__________________________________  ________________________________________________________________________       Nasario Exon    An incentive spirometer is a tool that can help keep your lungs clear and active. This tool measures how well you are filling your lungs with each breath. Taking long deep breaths may help reverse or decrease the chance of developing breathing (pulmonary) problems (especially infection) following: A long period of time when you are unable to move or be active. BEFORE THE PROCEDURE  If the spirometer includes an indicator to show your best effort, your nurse or respiratory therapist will set it to a desired goal. If possible, sit up straight or lean slightly forward. Try not to slouch. Hold the incentive spirometer in an upright position. INSTRUCTIONS FOR USE  Sit on the edge of your bed if possible, or sit up as far as you can in bed or on a chair. Hold the incentive spirometer in an upright position. Breathe out normally. Place the mouthpiece in your mouth and seal your lips tightly around it. Breathe in slowly and as deeply as possible, raising the piston or the ball toward the top of the column. Hold your breath for 3-5 seconds or for as long as possible. Allow the piston or ball to  fall to the bottom of the column. Remove the mouthpiece from your mouth and breathe out normally. Rest for a few seconds and repeat Steps 1 through 7 at least 10 times every 1-2 hours when you are awake. Take your time and take a few normal breaths between deep breaths. The spirometer may include an indicator to show your best effort. Use the indicator as a goal to work toward during each repetition. After each set of 10 deep breaths, practice coughing to be sure your lungs are clear. If you have an incision (the cut made at the time of surgery), support your incision when coughing by placing a pillow or rolled up towels firmly against it. Once you are able to get out of bed, walk around indoors and cough well. You may stop using the incentive spirometer when instructed by your caregiver.  RISKS AND COMPLICATIONS Take your time so you do not get dizzy or light-headed. If  you are in pain, you may need to take or ask for pain medication before doing incentive spirometry. It is harder to take a deep breath if you are having pain. AFTER USE Rest and breathe slowly and easily. It can be helpful to keep track of a log of your progress. Your caregiver can provide you with a simple table to help with this. If you are using the spirometer at home, follow these instructions: SEEK MEDICAL CARE IF:  You are having difficultly using the spirometer. You have trouble using the spirometer as often as instructed. Your pain medication is not giving enough relief while using the spirometer. You develop fever of 100.5 F (38.1 C) or higher.                                                                                                    SEEK IMMEDIATE MEDICAL CARE IF:  You cough up bloody sputum that had not been present before. You develop fever of 102 F (38.9 C) or greater. You develop worsening pain at or near the incision site. MAKE SURE YOU:  Understand these instructions. Will watch your  condition. Will get help right away if you are not doing well or get worse. Document Released: 06/24/2006 Document Revised: 05/06/2011 Document Reviewed: 08/25/2006 [facility] Patient Information 2014 [place], [place].

## 2024-03-08 ENCOUNTER — Encounter (HOSPITAL_COMMUNITY): Payer: Self-pay

## 2024-03-08 ENCOUNTER — Encounter (HOSPITAL_COMMUNITY)
Admission: RE | Admit: 2024-03-08 | Discharge: 2024-03-08 | Disposition: A | Source: Ambulatory Visit | Attending: Orthopedic Surgery

## 2024-03-08 ENCOUNTER — Other Ambulatory Visit: Payer: Self-pay

## 2024-03-08 VITALS — BP 140/113 | HR 106 | Temp 98.0°F | Resp 20 | Ht 70.0 in | Wt 229.0 lb

## 2024-03-08 DIAGNOSIS — I10 Essential (primary) hypertension: Secondary | ICD-10-CM | POA: Diagnosis not present

## 2024-03-08 DIAGNOSIS — Z01812 Encounter for preprocedural laboratory examination: Secondary | ICD-10-CM | POA: Insufficient documentation

## 2024-03-08 DIAGNOSIS — Z01818 Encounter for other preprocedural examination: Secondary | ICD-10-CM

## 2024-03-08 HISTORY — DX: Sleep apnea, unspecified: G47.30

## 2024-03-08 HISTORY — DX: Personal history of urinary calculi: Z87.442

## 2024-03-08 LAB — CBC
HCT: 49 % (ref 39.0–52.0)
Hemoglobin: 15.6 g/dL (ref 13.0–17.0)
MCH: 28.7 pg (ref 26.0–34.0)
MCHC: 31.8 g/dL (ref 30.0–36.0)
MCV: 90.1 fL (ref 80.0–100.0)
Platelets: 199 K/uL (ref 150–400)
RBC: 5.44 MIL/uL (ref 4.22–5.81)
RDW: 13.3 % (ref 11.5–15.5)
WBC: 8 K/uL (ref 4.0–10.5)
nRBC: 0 % (ref 0.0–0.2)

## 2024-03-08 LAB — BASIC METABOLIC PANEL WITH GFR
Anion gap: 11 (ref 5–15)
BUN: 24 mg/dL — ABNORMAL HIGH (ref 6–20)
CO2: 23 mmol/L (ref 22–32)
Calcium: 9.6 mg/dL (ref 8.9–10.3)
Chloride: 105 mmol/L (ref 98–111)
Creatinine, Ser: 1.51 mg/dL — ABNORMAL HIGH (ref 0.61–1.24)
GFR, Estimated: 54 mL/min — ABNORMAL LOW
Glucose, Bld: 95 mg/dL (ref 70–99)
Potassium: 4.9 mmol/L (ref 3.5–5.1)
Sodium: 140 mmol/L (ref 135–145)

## 2024-03-12 ENCOUNTER — Encounter (HOSPITAL_COMMUNITY): Admission: RE | Disposition: A | Payer: Self-pay | Source: Home / Self Care | Attending: Orthopedic Surgery

## 2024-03-12 ENCOUNTER — Encounter (HOSPITAL_COMMUNITY): Payer: Self-pay | Admitting: Medical

## 2024-03-12 ENCOUNTER — Ambulatory Visit (HOSPITAL_COMMUNITY): Admitting: Anesthesiology

## 2024-03-12 ENCOUNTER — Ambulatory Visit (HOSPITAL_COMMUNITY)
Admission: RE | Admit: 2024-03-12 | Discharge: 2024-03-12 | Disposition: A | Discharge status: Home / Self Care | Attending: Orthopedic Surgery | Admitting: Orthopedic Surgery

## 2024-03-12 ENCOUNTER — Encounter (HOSPITAL_COMMUNITY): Payer: Self-pay | Admitting: Orthopedic Surgery

## 2024-03-12 DIAGNOSIS — M75102 Unspecified rotator cuff tear or rupture of left shoulder, not specified as traumatic: Secondary | ICD-10-CM | POA: Diagnosis not present

## 2024-03-12 DIAGNOSIS — G4730 Sleep apnea, unspecified: Secondary | ICD-10-CM | POA: Diagnosis not present

## 2024-03-12 DIAGNOSIS — X58XXXA Exposure to other specified factors, initial encounter: Secondary | ICD-10-CM | POA: Insufficient documentation

## 2024-03-12 DIAGNOSIS — S43432A Superior glenoid labrum lesion of left shoulder, initial encounter: Secondary | ICD-10-CM | POA: Insufficient documentation

## 2024-03-12 DIAGNOSIS — J4489 Other specified chronic obstructive pulmonary disease: Secondary | ICD-10-CM | POA: Insufficient documentation

## 2024-03-12 DIAGNOSIS — M19012 Primary osteoarthritis, left shoulder: Secondary | ICD-10-CM | POA: Diagnosis not present

## 2024-03-12 DIAGNOSIS — I10 Essential (primary) hypertension: Secondary | ICD-10-CM | POA: Insufficient documentation

## 2024-03-12 DIAGNOSIS — K219 Gastro-esophageal reflux disease without esophagitis: Secondary | ICD-10-CM | POA: Diagnosis not present

## 2024-03-12 HISTORY — PX: SHOULDER OPEN ROTATOR CUFF REPAIR: SHX2407

## 2024-03-12 MED ORDER — SUGAMMADEX SODIUM 200 MG/2ML IV SOLN
INTRAVENOUS | Status: DC | PRN
  Administered 2024-03-12: 200 mg via INTRAVENOUS

## 2024-03-12 MED ORDER — PHENYLEPHRINE HCL-NACL 20-0.9 MG/250ML-% IV SOLN
INTRAVENOUS | Status: AC
  Filled 2024-03-12: qty 250

## 2024-03-12 MED ORDER — LIDOCAINE 2% (20 MG/ML) 5 ML SYRINGE
INTRAMUSCULAR | Status: DC | PRN
  Administered 2024-03-12: 20 mg via INTRAVENOUS

## 2024-03-12 MED ORDER — CEFAZOLIN SODIUM-DEXTROSE 2-4 GM/100ML-% IV SOLN
2.0000 g | INTRAVENOUS | Status: AC
  Administered 2024-03-12: 2 g via INTRAVENOUS

## 2024-03-12 MED ORDER — ONDANSETRON HCL 4 MG/2ML IJ SOLN
INTRAMUSCULAR | Status: AC
  Filled 2024-03-12: qty 2

## 2024-03-12 MED ORDER — BUPIVACAINE-EPINEPHRINE (PF) 0.5% -1:200000 IJ SOLN
INTRAMUSCULAR | Status: DC | PRN
  Administered 2024-03-12: 10 mL via PERINEURAL

## 2024-03-12 MED ORDER — PROPOFOL 10 MG/ML IV BOLUS
INTRAVENOUS | Status: AC
  Filled 2024-03-12: qty 20

## 2024-03-12 MED ORDER — HYDROMORPHONE HCL 1 MG/ML IJ SOLN
INTRAMUSCULAR | Status: AC
  Filled 2024-03-12: qty 1

## 2024-03-12 MED ORDER — MIDAZOLAM HCL (PF) 2 MG/2ML IJ SOLN: 0.5000 mg | Freq: Once | INTRAMUSCULAR | Status: DC | PRN

## 2024-03-12 MED ORDER — LACTATED RINGERS IV SOLN: INTRAVENOUS | Status: DC

## 2024-03-12 MED ORDER — ORAL CARE MOUTH RINSE: 15.0000 mL | Freq: Once | OROMUCOSAL | Status: AC

## 2024-03-12 MED ORDER — DEXAMETHASONE SOD PHOSPHATE PF 10 MG/ML IJ SOLN
INTRAMUSCULAR | Status: DC | PRN
  Administered 2024-03-12: 10 mg via INTRAVENOUS

## 2024-03-12 MED ORDER — CHLORHEXIDINE GLUCONATE 0.12 % MT SOLN
15.0000 mL | Freq: Once | OROMUCOSAL | Status: AC
  Administered 2024-03-12: 15 mL via OROMUCOSAL

## 2024-03-12 MED ORDER — PHENYLEPHRINE HCL-NACL 20-0.9 MG/250ML-% IV SOLN
INTRAVENOUS | Status: DC | PRN
  Administered 2024-03-12: 15 ug/min via INTRAVENOUS

## 2024-03-12 MED ORDER — PHENYLEPHRINE 80 MCG/ML (10ML) SYRINGE FOR IV PUSH (FOR BLOOD PRESSURE SUPPORT)
PREFILLED_SYRINGE | INTRAVENOUS | Status: AC
  Filled 2024-03-12: qty 10

## 2024-03-12 MED ORDER — OXYCODONE HCL 5 MG/5ML PO SOLN: 5.0000 mg | Freq: Once | ORAL | Status: AC | PRN

## 2024-03-12 MED ORDER — LABETALOL HCL 5 MG/ML IV SOLN
INTRAVENOUS | Status: AC
  Filled 2024-03-12: qty 4

## 2024-03-12 MED ORDER — SUGAMMADEX SODIUM 200 MG/2ML IV SOLN
INTRAVENOUS | Status: AC
  Filled 2024-03-12: qty 2

## 2024-03-12 MED ORDER — ROCURONIUM 10MG/ML (10ML) SYRINGE FOR MEDFUSION PUMP - OPTIME
INTRAVENOUS | Status: DC | PRN
  Administered 2024-03-12: 60 mg via INTRAVENOUS

## 2024-03-12 MED ORDER — LIDOCAINE HCL (PF) 2 % IJ SOLN
INTRAMUSCULAR | Status: AC
  Filled 2024-03-12: qty 5

## 2024-03-12 MED ORDER — BUPIVACAINE LIPOSOME 1.3 % IJ SUSP
INTRAMUSCULAR | Status: DC | PRN
  Administered 2024-03-12: 10 mL via PERINEURAL

## 2024-03-12 MED ORDER — CEFAZOLIN SODIUM-DEXTROSE 2-4 GM/100ML-% IV SOLN
INTRAVENOUS | Status: AC
  Filled 2024-03-12: qty 100

## 2024-03-12 MED ORDER — ONDANSETRON HCL 4 MG/2ML IJ SOLN
INTRAMUSCULAR | Status: DC | PRN
  Administered 2024-03-12: 4 mg via INTRAVENOUS

## 2024-03-12 MED ORDER — BUPIVACAINE-EPINEPHRINE (PF) 0.25% -1:200000 IJ SOLN
INTRAMUSCULAR | Status: AC
  Filled 2024-03-12: qty 30

## 2024-03-12 MED ORDER — ONDANSETRON HCL 4 MG PO TABS: 4.0000 mg | ORAL_TABLET | Freq: Three times a day (TID) | ORAL | 0 refills | Status: AC | PRN

## 2024-03-12 MED ORDER — HYDROMORPHONE HCL 1 MG/ML IJ SOLN
0.2500 mg | INTRAMUSCULAR | Status: DC | PRN
  Administered 2024-03-12 (×3): 0.5 mg via INTRAVENOUS

## 2024-03-12 MED ORDER — SODIUM CHLORIDE 0.9 % IR SOLN
Status: DC | PRN
  Administered 2024-03-12: 3000 mL

## 2024-03-12 MED ORDER — BUPIVACAINE-EPINEPHRINE 0.5% -1:200000 IJ SOLN
INTRAMUSCULAR | Status: DC | PRN
  Administered 2024-03-12: 30 mL

## 2024-03-12 MED ORDER — FENTANYL CITRATE (PF) 50 MCG/ML IJ SOSY
50.0000 ug | PREFILLED_SYRINGE | Freq: Once | INTRAMUSCULAR | Status: DC
  Filled 2024-03-12: qty 2

## 2024-03-12 MED ORDER — OXYCODONE HCL 5 MG PO TABS: 5.0000 mg | ORAL_TABLET | Freq: Three times a day (TID) | ORAL | 0 refills | Status: AC | PRN

## 2024-03-12 MED ORDER — AMISULPRIDE (ANTIEMETIC) 5 MG/2ML IV SOLN
10.0000 mg | Freq: Once | INTRAVENOUS | Status: AC
  Administered 2024-03-12: 10 mg via INTRAVENOUS

## 2024-03-12 MED ORDER — OXYCODONE HCL 5 MG PO TABS
ORAL_TABLET | ORAL | Status: AC
  Filled 2024-03-12: qty 1

## 2024-03-12 MED ORDER — PROPOFOL 10 MG/ML IV BOLUS
INTRAVENOUS | Status: DC | PRN
  Administered 2024-03-12: 200 mg via INTRAVENOUS

## 2024-03-12 MED ORDER — AMISULPRIDE (ANTIEMETIC) 5 MG/2ML IV SOLN
INTRAVENOUS | Status: AC
  Filled 2024-03-12: qty 4

## 2024-03-12 MED ORDER — DEXMEDETOMIDINE HCL IN NACL 80 MCG/20ML IV SOLN
INTRAVENOUS | Status: DC | PRN
  Administered 2024-03-12 (×2): 10 ug via INTRAVENOUS

## 2024-03-12 MED ORDER — MIDAZOLAM HCL (PF) 2 MG/2ML IJ SOLN
1.0000 mg | Freq: Once | INTRAMUSCULAR | Status: AC
  Administered 2024-03-12: 2 mg via INTRAVENOUS
  Filled 2024-03-12: qty 2

## 2024-03-12 MED ORDER — FENTANYL CITRATE (PF) 100 MCG/2ML IJ SOLN
INTRAMUSCULAR | Status: AC
  Filled 2024-03-12: qty 2

## 2024-03-12 MED ORDER — ROCURONIUM BROMIDE 10 MG/ML (PF) SYRINGE
PREFILLED_SYRINGE | INTRAVENOUS | Status: AC
  Filled 2024-03-12: qty 10

## 2024-03-12 MED ORDER — DEXAMETHASONE SOD PHOSPHATE PF 10 MG/ML IJ SOLN
INTRAMUSCULAR | Status: AC
  Filled 2024-03-12: qty 1

## 2024-03-12 MED ORDER — ACETAMINOPHEN 500 MG PO TABS
1000.0000 mg | ORAL_TABLET | Freq: Once | ORAL | Status: AC
  Filled 2024-03-12: qty 2

## 2024-03-12 MED ORDER — CYCLOBENZAPRINE HCL 10 MG PO TABS: 10.0000 mg | ORAL_TABLET | Freq: Three times a day (TID) | ORAL | 0 refills | Status: AC | PRN

## 2024-03-12 MED ORDER — OXYCODONE HCL 5 MG PO TABS
5.0000 mg | ORAL_TABLET | Freq: Once | ORAL | Status: AC | PRN
  Administered 2024-03-12: 5 mg via ORAL

## 2024-03-12 MED ORDER — EPHEDRINE 5 MG/ML INJ
INTRAVENOUS | Status: AC
  Filled 2024-03-12: qty 5

## 2024-03-12 MED ORDER — FENTANYL CITRATE (PF) 250 MCG/5ML IJ SOLN
INTRAMUSCULAR | Status: DC | PRN
  Administered 2024-03-12: 50 ug via INTRAVENOUS
  Administered 2024-03-12: 100 ug via INTRAVENOUS

## 2024-03-12 MED ORDER — LABETALOL HCL 5 MG/ML IV SOLN
5.0000 mg | INTRAVENOUS | Status: DC | PRN
  Administered 2024-03-12: 5 mg via INTRAVENOUS

## 2024-03-12 MED ORDER — ACETAMINOPHEN 500 MG PO TABS
ORAL_TABLET | ORAL | Status: AC
  Administered 2024-03-12: 1000 mg via ORAL
  Filled 2024-03-12: qty 2

## 2024-03-12 SURGICAL SUPPLY — 1 items: ANCHOR ROTATOR CUFF #2 (Anchor) IMPLANT

## 2024-03-12 NOTE — Discharge Instructions (Signed)
 Ice to the shoulder constantly.  Keep the incision covered until Monday then ok to remove the bandage and leave incision open to air, then ok to get it wet in the shower on Wedneday.  Do exercise as instructed several times per day.  Pendulums - dangle arm in circles, lean over the left arm  Do pillow slides, with arm resting on the pillow slide the hand forward and back  Do gentle rotation exercises, with the elbow resting on a pillow in your lap, rotate the arm in toward you and out away from you like a gate swinging closed and open  DO NOT reach behind your back or push up out of a chair with the operative arm.  Use a sling while you are up and around for comfort, may remove while seated.  Keep pillow propped behind the operative elbow.  Follow up with Dr Kay in two weeks in the office, call [phone] for appt

## 2024-03-12 NOTE — Anesthesia Preprocedure Evaluation (Addendum)
"                                    Anesthesia Evaluation  Patient identified by MRN, date of birth, ID band Patient awake    Reviewed: Allergy & Precautions, NPO status , Patient's Chart, lab work & pertinent test results  History of Anesthesia Complications Negative for: history of anesthetic complications  Airway Mallampati: II  TM Distance: >3 FB Neck ROM: Full    Dental  (+) Dental Advisory Given, Chipped   Pulmonary asthma , sleep apnea and Continuous Positive Airway Pressure Ventilation , COPD (does not require inhalers)   breath sounds clear to auscultation       Cardiovascular hypertension, Pt. on medications (-) angina  Rhythm:Regular Rate:Normal     Neuro/Psych   Anxiety     negative neurological ROS     GI/Hepatic Neg liver ROS,GERD  Medicated and Controlled,,  Endo/Other  BMI 32  Renal/GU negative Renal ROS     Musculoskeletal  (+) Arthritis ,    Abdominal   Peds  Hematology Hb 15.6, plt 199k   Anesthesia Other Findings   Reproductive/Obstetrics                              Anesthesia Physical Anesthesia Plan  ASA: 2  Anesthesia Plan: General   Post-op Pain Management: Tylenol  PO (pre-op)* and Regional block*   Induction: Intravenous  PONV Risk Score and Plan: 2 and Ondansetron  and Dexamethasone   Airway Management Planned: Oral ETT  Additional Equipment: None  Intra-op Plan:   Post-operative Plan: Extubation in OR  Informed Consent: I have reviewed the patients History and Physical, chart, labs and discussed the procedure including the risks, benefits and alternatives for the proposed anesthesia with the patient or authorized representative who has indicated his/her understanding and acceptance.     Dental advisory given  Plan Discussed with: CRNA and Surgeon  Anesthesia Plan Comments: (Plan routine monitors, GETA with interscalene block for post op analgesia)         Anesthesia  Quick Evaluation  "

## 2024-03-12 NOTE — Anesthesia Procedure Notes (Signed)
 Anesthesia Regional Block: Interscalene brachial plexus block   Pre-Anesthetic Checklist: , timeout performed,  Correct Patient, Correct Site, Correct Laterality,  Correct Procedure, Correct Position, site marked,  Risks and benefits discussed,  Surgical consent,  Pre-op evaluation,  At surgeon's request and post-op pain management  Laterality: Left and Upper  Prep: chloraprep       Needles:  Injection technique: Single-shot  Needle Type: Echogenic Needle     Needle Length: 9cm  Needle Gauge: 21     Additional Needles:   Procedures:,,,, ultrasound used (permanent image in chart),,    Narrative:  Start time: 03/12/2024 1:09 PM End time: 03/12/2024 1:16 PM Injection made incrementally with aspirations every 5 mL.  Performed by: Personally  Anesthesiologist: Leonce Athens, MD  Additional Notes: Pt identified in Holding room.  Monitors applied. Working IV access confirmed. Timeout, Sterile prep L clavicle and neck.  #21ga ECHOgenic Arrow block needle to interscalene brachial plexus with US  guidance.  10cc 0.5% Bupivacaine  1:200k epi, Exparel  injected incrementally after negative test dose.  Patient asymptomatic, VSS, no heme aspirated, tolerated well.   JAYSON Leonce, MD

## 2024-03-12 NOTE — Interval H&P Note (Signed)
 History and Physical Interval Note:  03/12/2024 1:01 PM  Shawn Owens  has presented today for surgery, with the diagnosis of Tear of left rotator cuff, unspecified tear extent, unspecified whether traumatic.  The various methods of treatment have been discussed with the patient and family. After consideration of risks, benefits and other options for treatment, the patient has consented to  Procedures with comments: ARTHROSCOPY, SHOULDER WITH DEBRIDEMENT (Left) - Left shoulder scope, SAD, mini open RCR, biceps tenodesis and DCR REPAIR, ROTATOR CUFF, OPEN (Left) as a surgical intervention.  The patient's history has been reviewed, patient examined, no change in status, stable for surgery.  I have reviewed the patient's chart and labs.  Questions were answered to the patient's satisfaction.     [name]

## 2024-03-12 NOTE — Brief Op Note (Signed)
 03/12/2024  1:15 PM  4:09 PM  PATIENT:  Shawn Owens  59 y.o. male  PRE-OPERATIVE DIAGNOSIS:  Tear of left rotator cuff, SLAP tear, AC DJD  POST-OPERATIVE DIAGNOSIS:  Tear of left rotator cuff, SLAP tear, AC DJD  PROCEDURE:  Procedures with comments: ARTHROSCOPY, SHOULDER WITH DEBRIDEMENT (Left) - Left shoulder scope, SAD, mini open RCR, biceps tenodesis and DCR REPAIR, ROTATOR CUFF, OPEN (Left)  SURGEON:  Surgeons and Role:    DEWAINE Kay Kemps, MD - Primary  PHYSICIAN ASSISTANT:   ASSISTANTS: none   ANESTHESIA:   regional and general  EBL:  50 mL   BLOOD ADMINISTERED:none  DRAINS: none   LOCAL MEDICATIONS USED:  MARCAINE      SPECIMEN:  No Specimen  DISPOSITION OF SPECIMEN:  N/A  COUNTS:  YES  TOURNIQUET:  * No tourniquets in log *  DICTATION: .Other Dictation: Dictation Number [id number]  PLAN OF CARE: Discharge to home after PACU  PATIENT DISPOSITION:  PACU - hemodynamically stable.   Delay start of Pharmacological VTE agent (>24hrs) due to surgical blood loss or risk of bleeding: not applicable

## 2024-03-12 NOTE — Op Note (Signed)
 NAMEJHAIR, WITHERINGTON MEDICAL RECORD NO: [id number] ACCOUNT NO: [id number] DATE OF BIRTH: April 27, 1965 FACILITY: [facility] LOCATION: [facility] PHYSICIAN: Elspeth SAUNDERS. Kay, MD  Operative Report   DATE OF PROCEDURE: 03/12/2024  PREOPERATIVE DIAGNOSES:   1.  Left shoulder rotator cuff tear. 2.  Superior labrum anterior to posterior tear. 3.  Acromioclavicular arthritis.  POSTOPERATIVE DIAGNOSES:   1.  Left shoulder rotator cuff tear. 2.  Superior labrum anterior to posterior tear. 3.  Acromioclavicular arthritis.  PROCEDURE PERFORMED:   1.  Left shoulder arthroscopy with arthroscopic subacromial decompression. 2.  Mini-open rotator cuff repair. 3.  Extensive intra-articular debridement including debridement of superior labrum tear anterior to posterior. 4.  Arthroscopic biceps tenotomy. 5.  Arthroscopic subacromial decompression. 6.  Biceps tenodesis in the groove. 7.  Open distal clavicle resection.  ATTENDING SURGEON:  Elspeth SAUNDERS. Kay, MD  ASSISTANT:  None.  ANESTHESIA:  General anesthesia was used plus interscalene block.  ESTIMATED BLOOD LOSS:  Less than 50 mL.  FLUID REPLACEMENT:  1200 mL crystalloid.  INSTRUMENT COUNTS:  Correct.  COMPLICATIONS:  None.  ANTIBIOTICS:  Perioperative antibiotics were given.  INDICATIONS:  The patient is a 59 year old male who presents with a history of worsening left shoulder pain secondary to rotator cuff tear, SLAP tear, and AC arthritis.  The patient has failed conservative management desires operative treatment to  eliminate pain and restore function.  Informed consent obtained.  DESCRIPTION OF PROCEDURE:  After an adequate level of anesthesia was achieved, the patient was positioned in the modified beach chair position.  Left shoulder correctly identified and sterile prep and drape performed.  Timeout called verifying correct  patient and correct site.  We entered the patient's shoulder using standard arthroscopic portals including  anterior, posterior, and lateral portals.  We identified significant tearing of the intra-articular biceps.  Almost 50% of the tendon was torn.   Superior labrum was also torn.  We performed a biceps tenotomy and labral debridement back to a stable labral rim using baskets and a suction shaver.  Subscapularis was intact.  Supraspinatus and infraspinatus were torn.  Teres minor was intact.   Articular cartilage in good shape.  We placed the scope in the subacromial space.  Bursectomy and acromioplasty was performed using a butcher block technique with a high-speed burr, creating a type 1 acromial shape.  We inspected the rotator cuff from  the bursal surface and noted there to be a full-thickness tear communicating to the bursal side.  Once we had our decompression complete, we went ahead and made a small saber incision overlying the AC joint.  Dissection down through subcutaneous tissues using Bovie.  We identified deltotrapezial fascia, and it was incised in line with the distal  clavicle.  Subperiosteal dissection of distal clavicle performed followed by excision of distal 2-3 mm of bone using an oscillating saw.  We applied bone wax to the cut end of the clavicle.  We irrigated it thoroughly and then repaired the deltotrapezial  fascia with interrupted 0 Vicryl suture, followed by 2-0 Vicryl for subcutaneous closure and 4-0 Monocryl for skin.  We then addressed the rotator cuff and the biceps through a single mini-open incision starting at the anterolateral border of the  acromion, extending distally about 4 cm in the raphe between the anterior and lateral heads.  Dissection down through subcutaneous tissues after a 10 blade scalpel for skin.  We identified the deltoid raphe and split that with a needle-tip Bovie.  We  placed our Arthrex retractor.  We identified the biceps groove and delivered the biceps tendon out of the biceps sheath.  We whipstitched that with #2 FiberWire suture to reinforce the  tendon.  We then went ahead and prepped the floor of the biceps  groove with the needle-tip Bovie and a freer elevator, getting down to bleeding bone.  We then drilled and placed a single Y-Knot Flex anchor through the floor of the biceps groove, brought that suture anchor up in a mattress fashion through the  reinforced tendon, tying that down flush to the tunnel.  We brought the whipstitch sutures, up through the rotator interval and tied over a soft tissue bridge incorporating part of the subscapularis.  We then went to the rotator cuff tear.  This was  about a 2 cm tear.  We placed #2 Hi-Fi suture x4 into the free edge of the tendon.  We mobilized the tendon with a Cobb elevator and prepped the greater tuberosity with a rongeur and curette, getting to bleeding bone.  We took a single Y-Knot RC anchor  double-loaded with ribbon and placed that at the medial part of the footprint, brought that up in a double mattress fashion to restore the medial part of the footprint.  We tied that down flush.  We took the lateral sutures down through drill holes and  tied over a lateral bone bridge.  We had a nice double-row repair, not under tension and watertight.  At this point, we irrigated thoroughly.  We ranged the shoulder.  No impingement noted.  We then repaired the deltoid anatomically with 0 Vicryl suture,  followed by 2-0 Vicryl for subcutaneous closure and 4-0 Monocryl for skin and portals.  Steri-Strips were applied, followed by a sterile dressing.  The patient tolerated the surgery well.   PUS D: 03/12/2024 4:15:07 pm T: 03/12/2024 4:38:00 pm  JOB: [id number]/ [id number]

## 2024-03-12 NOTE — Anesthesia Postprocedure Evaluation (Signed)
"   Anesthesia Post Note  Patient: Shawn Owens  Procedure(s) Performed: ARTHROSCOPY, SHOULDER WITH DEBRIDEMENT (Left) REPAIR, ROTATOR CUFF, OPEN (Left: Shoulder)     Patient location during evaluation: PACU Anesthesia Type: General Level of consciousness: awake and alert, oriented and patient cooperative Pain management: pain level controlled Vital Signs Assessment: post-procedure vital signs reviewed and stable Respiratory status: spontaneous breathing, nonlabored ventilation and respiratory function stable Cardiovascular status: blood pressure returned to baseline and stable : nausea improved. Anesthetic complications: no   No notable events documented.  Last Vitals:  Vitals:   03/12/24 1615 03/12/24 1630  BP: (!) 136/98 (!) 132/94  Pulse: 90 94  Resp: 14 17  Temp:    SpO2: 90% 92%    Last Pain:  Vitals:   03/12/24 1640  TempSrc:   PainSc: 4                  [name],E. Keelen Quevedo      "

## 2024-03-12 NOTE — Anesthesia Procedure Notes (Signed)
 Procedure Name: Intubation Date/Time: 03/12/2024 1:43 PM  Performed by: Sofie Barter, RNPre-anesthesia Checklist: Patient identified, Emergency Drugs available, Suction available and Patient being monitored Patient Re-evaluated:Patient Re-evaluated prior to induction Oxygen Delivery Method: Circle system utilized Preoxygenation: Pre-oxygenation with 100% oxygen Induction Type: IV induction Ventilation: Mask ventilation without difficulty and Oral airway inserted - appropriate to patient size Grade View: Grade II Tube type: Oral Tube size: 7.5 mm Number of attempts: 1 Airway Equipment and Method: Stylet and Oral airway Placement Confirmation: ETT inserted through vocal cords under direct vision, positive ETCO2 and breath sounds checked- equal and bilateral Secured at: 22 cm Tube secured with: Tape Dental Injury: Teeth and Oropharynx as per pre-operative assessment

## 2024-03-12 NOTE — Transfer of Care (Signed)
 Immediate Anesthesia Transfer of Care Note  Patient: Shawn Owens  Procedure(s) Performed: ARTHROSCOPY, SHOULDER WITH DEBRIDEMENT (Left) REPAIR, ROTATOR CUFF, OPEN (Left: Shoulder)  Patient Location: PACU  Anesthesia Type:General and GA combined with regional for post-op pain  Level of Consciousness: drowsy and patient cooperative  Airway & Oxygen Therapy: Patient Spontanous Breathing and Patient connected to face mask oxygen  Post-op Assessment: Report given to RN and Post -op Vital signs reviewed and stable  Post vital signs: Reviewed and stable  Last Vitals:  Vitals Value Taken Time  BP 143/100 03/12/24 15:49  Temp    Pulse 91 03/12/24 15:50  Resp 13 03/12/24 15:50  SpO2 100 % 03/12/24 15:50  Vitals shown include unfiled device data.  Last Pain:  Vitals:   03/12/24 1244  TempSrc:   PainSc: 0-No pain         Complications: No notable events documented.

## 2024-03-16 ENCOUNTER — Encounter (HOSPITAL_COMMUNITY): Payer: Self-pay | Admitting: Orthopedic Surgery
